# Patient Record
Sex: Female | Born: 1945 | Race: Black or African American | Hispanic: No | Smoking: Never smoker
Health system: Southern US, Community
[De-identification: ages and names within clinical notes are randomized; demographics above are authoritative.]

## PROBLEM LIST (undated history)

## (undated) DIAGNOSIS — E7800 Pure hypercholesterolemia, unspecified: Secondary | ICD-10-CM

## (undated) DIAGNOSIS — D649 Anemia, unspecified: Secondary | ICD-10-CM

## (undated) DIAGNOSIS — I10 Essential (primary) hypertension: Secondary | ICD-10-CM

## (undated) DIAGNOSIS — R42 Dizziness and giddiness: Secondary | ICD-10-CM

## (undated) HISTORY — PX: REPLACEMENT TOTAL KNEE: SUR1224

## (undated) HISTORY — DX: Essential (primary) hypertension: I10

## (undated) HISTORY — PX: BREAST BIOPSY: SHX20

## (undated) HISTORY — PX: ABDOMINAL HYSTERECTOMY: SHX81

## (undated) HISTORY — DX: Dizziness and giddiness: R42

## (undated) HISTORY — DX: Anemia, unspecified: D64.9

---

## 2002-03-02 ENCOUNTER — Emergency Department (HOSPITAL_COMMUNITY): Admission: EM | Admit: 2002-03-02 | Discharge: 2002-03-02 | Payer: Self-pay | Admitting: Internal Medicine

## 2002-03-02 ENCOUNTER — Encounter: Payer: Self-pay | Admitting: Internal Medicine

## 2004-02-24 ENCOUNTER — Emergency Department (HOSPITAL_COMMUNITY): Admission: EM | Admit: 2004-02-24 | Discharge: 2004-02-24 | Payer: Self-pay | Admitting: Emergency Medicine

## 2005-08-16 ENCOUNTER — Ambulatory Visit: Payer: Self-pay

## 2006-06-27 ENCOUNTER — Emergency Department (HOSPITAL_COMMUNITY): Admission: EM | Admit: 2006-06-27 | Discharge: 2006-06-27 | Payer: Self-pay | Admitting: Emergency Medicine

## 2006-09-16 ENCOUNTER — Emergency Department (HOSPITAL_COMMUNITY): Admission: EM | Admit: 2006-09-16 | Discharge: 2006-09-16 | Payer: Self-pay | Admitting: Emergency Medicine

## 2007-01-03 ENCOUNTER — Ambulatory Visit: Payer: Self-pay | Admitting: Orthopedic Surgery

## 2007-07-22 ENCOUNTER — Emergency Department (HOSPITAL_COMMUNITY): Admission: EM | Admit: 2007-07-22 | Discharge: 2007-07-23 | Payer: Self-pay | Admitting: Emergency Medicine

## 2007-08-17 ENCOUNTER — Emergency Department (HOSPITAL_COMMUNITY): Admission: EM | Admit: 2007-08-17 | Discharge: 2007-08-17 | Payer: Self-pay | Admitting: Emergency Medicine

## 2007-08-23 DIAGNOSIS — E119 Type 2 diabetes mellitus without complications: Secondary | ICD-10-CM

## 2007-08-26 ENCOUNTER — Ambulatory Visit: Payer: Self-pay | Admitting: Orthopedic Surgery

## 2007-08-26 DIAGNOSIS — M1710 Unilateral primary osteoarthritis, unspecified knee: Secondary | ICD-10-CM

## 2007-08-26 DIAGNOSIS — IMO0002 Reserved for concepts with insufficient information to code with codable children: Secondary | ICD-10-CM | POA: Insufficient documentation

## 2007-10-14 ENCOUNTER — Ambulatory Visit: Payer: Self-pay | Admitting: Orthopedic Surgery

## 2007-10-14 ENCOUNTER — Inpatient Hospital Stay (HOSPITAL_COMMUNITY): Admission: EM | Admit: 2007-10-14 | Discharge: 2007-10-17 | Payer: Self-pay | Admitting: Emergency Medicine

## 2007-10-16 ENCOUNTER — Encounter: Payer: Self-pay | Admitting: Orthopedic Surgery

## 2007-10-17 ENCOUNTER — Encounter: Payer: Self-pay | Admitting: Orthopedic Surgery

## 2007-11-27 ENCOUNTER — Ambulatory Visit: Payer: Self-pay | Admitting: Orthopedic Surgery

## 2007-12-02 ENCOUNTER — Emergency Department (HOSPITAL_COMMUNITY): Admission: EM | Admit: 2007-12-02 | Discharge: 2007-12-02 | Payer: Self-pay | Admitting: Emergency Medicine

## 2007-12-11 ENCOUNTER — Ambulatory Visit: Payer: Self-pay | Admitting: Gastroenterology

## 2007-12-13 ENCOUNTER — Ambulatory Visit (HOSPITAL_COMMUNITY): Admission: RE | Admit: 2007-12-13 | Discharge: 2007-12-13 | Payer: Self-pay | Admitting: Gastroenterology

## 2007-12-13 ENCOUNTER — Ambulatory Visit: Payer: Self-pay | Admitting: Gastroenterology

## 2007-12-15 ENCOUNTER — Emergency Department (HOSPITAL_COMMUNITY): Admission: EM | Admit: 2007-12-15 | Discharge: 2007-12-15 | Payer: Self-pay | Admitting: Emergency Medicine

## 2007-12-26 ENCOUNTER — Ambulatory Visit (HOSPITAL_COMMUNITY): Admission: RE | Admit: 2007-12-26 | Discharge: 2007-12-26 | Payer: Self-pay | Admitting: Gastroenterology

## 2007-12-26 ENCOUNTER — Encounter: Payer: Self-pay | Admitting: Gastroenterology

## 2007-12-26 ENCOUNTER — Ambulatory Visit: Payer: Self-pay | Admitting: Gastroenterology

## 2007-12-30 ENCOUNTER — Ambulatory Visit: Payer: Self-pay | Admitting: Orthopedic Surgery

## 2007-12-31 ENCOUNTER — Encounter: Payer: Self-pay | Admitting: Orthopedic Surgery

## 2008-01-02 ENCOUNTER — Encounter: Payer: Self-pay | Admitting: Orthopedic Surgery

## 2008-01-03 ENCOUNTER — Telehealth: Payer: Self-pay | Admitting: Orthopedic Surgery

## 2008-02-06 ENCOUNTER — Ambulatory Visit: Payer: Self-pay | Admitting: Orthopedic Surgery

## 2008-03-19 ENCOUNTER — Ambulatory Visit: Payer: Self-pay | Admitting: Gastroenterology

## 2008-04-27 LAB — CONVERTED CEMR LAB: Pap Smear: NORMAL

## 2008-07-15 ENCOUNTER — Ambulatory Visit: Payer: Self-pay | Admitting: Gastroenterology

## 2008-08-16 ENCOUNTER — Emergency Department (HOSPITAL_COMMUNITY): Admission: EM | Admit: 2008-08-16 | Discharge: 2008-08-17 | Payer: Self-pay | Admitting: Emergency Medicine

## 2008-08-27 ENCOUNTER — Ambulatory Visit: Payer: Self-pay | Admitting: Orthopedic Surgery

## 2008-09-08 ENCOUNTER — Telehealth: Payer: Self-pay | Admitting: Orthopedic Surgery

## 2008-09-14 ENCOUNTER — Telehealth: Payer: Self-pay | Admitting: Orthopedic Surgery

## 2008-09-14 ENCOUNTER — Encounter: Payer: Self-pay | Admitting: Orthopedic Surgery

## 2008-09-15 ENCOUNTER — Ambulatory Visit: Payer: Self-pay | Admitting: Orthopedic Surgery

## 2008-09-15 ENCOUNTER — Inpatient Hospital Stay (HOSPITAL_COMMUNITY): Admission: RE | Admit: 2008-09-15 | Discharge: 2008-09-18 | Payer: Self-pay | Admitting: Orthopedic Surgery

## 2008-09-18 ENCOUNTER — Encounter: Payer: Self-pay | Admitting: Orthopedic Surgery

## 2008-09-19 ENCOUNTER — Encounter: Payer: Self-pay | Admitting: Orthopedic Surgery

## 2008-09-21 ENCOUNTER — Telehealth: Payer: Self-pay | Admitting: Orthopedic Surgery

## 2008-09-25 ENCOUNTER — Encounter: Payer: Self-pay | Admitting: Orthopedic Surgery

## 2008-09-28 ENCOUNTER — Encounter: Payer: Self-pay | Admitting: Orthopedic Surgery

## 2008-09-29 ENCOUNTER — Ambulatory Visit: Payer: Self-pay | Admitting: Orthopedic Surgery

## 2008-09-29 DIAGNOSIS — Z96659 Presence of unspecified artificial knee joint: Secondary | ICD-10-CM | POA: Insufficient documentation

## 2008-10-02 ENCOUNTER — Encounter: Payer: Self-pay | Admitting: Orthopedic Surgery

## 2008-10-16 ENCOUNTER — Encounter: Payer: Self-pay | Admitting: Orthopedic Surgery

## 2008-10-19 ENCOUNTER — Telehealth: Payer: Self-pay | Admitting: Orthopedic Surgery

## 2008-10-27 ENCOUNTER — Encounter: Payer: Self-pay | Admitting: Orthopedic Surgery

## 2008-10-27 ENCOUNTER — Ambulatory Visit: Payer: Self-pay | Admitting: Gastroenterology

## 2008-10-27 ENCOUNTER — Encounter (HOSPITAL_COMMUNITY): Admission: RE | Admit: 2008-10-27 | Discharge: 2008-11-12 | Payer: Self-pay | Admitting: Orthopedic Surgery

## 2008-10-28 ENCOUNTER — Ambulatory Visit: Payer: Self-pay | Admitting: Orthopedic Surgery

## 2008-11-16 ENCOUNTER — Encounter (HOSPITAL_COMMUNITY): Admission: RE | Admit: 2008-11-16 | Discharge: 2008-11-20 | Payer: Self-pay | Admitting: Orthopedic Surgery

## 2008-11-20 ENCOUNTER — Encounter: Payer: Self-pay | Admitting: Orthopedic Surgery

## 2008-11-20 ENCOUNTER — Ambulatory Visit: Payer: Self-pay | Admitting: Internal Medicine

## 2008-11-20 DIAGNOSIS — E785 Hyperlipidemia, unspecified: Secondary | ICD-10-CM | POA: Insufficient documentation

## 2008-11-20 DIAGNOSIS — H269 Unspecified cataract: Secondary | ICD-10-CM

## 2008-11-20 DIAGNOSIS — I10 Essential (primary) hypertension: Secondary | ICD-10-CM | POA: Insufficient documentation

## 2008-11-20 DIAGNOSIS — K219 Gastro-esophageal reflux disease without esophagitis: Secondary | ICD-10-CM

## 2008-11-20 DIAGNOSIS — D509 Iron deficiency anemia, unspecified: Secondary | ICD-10-CM | POA: Insufficient documentation

## 2008-11-20 DIAGNOSIS — J309 Allergic rhinitis, unspecified: Secondary | ICD-10-CM | POA: Insufficient documentation

## 2008-11-20 LAB — CONVERTED CEMR LAB
Blood Glucose, Fingerstick: 130
Hgb A1c MFr Bld: 6 %

## 2008-11-23 LAB — CONVERTED CEMR LAB
ALT: <8 units/L (ref 0–35)
AST: 7 units/L (ref 0–37)
Albumin: 4.5 g/dL (ref 3.5–5.2)
Alkaline Phosphatase: 70 units/L (ref 39–117)
BUN: 15 mg/dL (ref 6–23)
Basophils Absolute: 0 10*3/uL (ref 0.0–0.1)
Basophils Relative: 1 % (ref 0–1)
CO2: 21 meq/L (ref 19–32)
Calcium: 9.5 mg/dL (ref 8.4–10.5)
Chloride: 105 meq/L (ref 96–112)
Cholesterol: 204 mg/dL — ABNORMAL HIGH (ref 0–200)
Creatinine, Ser: 1.24 mg/dL — ABNORMAL HIGH (ref 0.40–1.20)
Creatinine, Urine: 244.5 mg/dL
Eosinophils Absolute: 0.1 10*3/uL (ref 0.0–0.7)
Eosinophils Relative: 3 % (ref 0–5)
Ferritin: 135 ng/mL (ref 10–291)
Glucose, Bld: 116 mg/dL — ABNORMAL HIGH (ref 70–99)
HCT: 33.3 % — ABNORMAL LOW (ref 36.0–46.0)
HDL: 38 mg/dL — ABNORMAL LOW (ref >39)
Hemoglobin: 10.9 g/dL — ABNORMAL LOW (ref 12.0–15.0)
Iron: 58 ug/dL (ref 42–145)
LDL Cholesterol: 148 mg/dL — ABNORMAL HIGH (ref 0–99)
Lymphocytes Relative: 47 % — ABNORMAL HIGH (ref 12–46)
Lymphs Abs: 1.7 10*3/uL (ref 0.7–4.0)
MCHC: 32.7 g/dL (ref 30.0–36.0)
MCV: 87.9 fL (ref 78.0–100.0)
Microalb Creat Ratio: 4.9 mg/g (ref 0.0–30.0)
Microalb, Ur: 1.19 mg/dL (ref 0.00–1.89)
Monocytes Absolute: 0.3 10*3/uL (ref 0.1–1.0)
Monocytes Relative: 7 % (ref 3–12)
Neutro Abs: 1.5 10*3/uL — ABNORMAL LOW (ref 1.7–7.7)
Neutrophils Relative %: 42 % — ABNORMAL LOW (ref 43–77)
Platelets: 254 10*3/uL (ref 150–400)
Potassium: 3.8 meq/L (ref 3.5–5.3)
RBC: 3.79 M/uL — ABNORMAL LOW (ref 3.87–5.11)
RDW: 13.5 % (ref 11.5–15.5)
Saturation Ratios: 18 % — ABNORMAL LOW (ref 20–55)
Sodium: 140 meq/L (ref 135–145)
TIBC: 322 ug/dL (ref 250–470)
Total Bilirubin: 0.3 mg/dL (ref 0.3–1.2)
Total CHOL/HDL Ratio: 5.4
Total Protein: 7.4 g/dL (ref 6.0–8.3)
Triglycerides: 92 mg/dL (ref <150)
UIBC: 264 ug/dL
VLDL: 18 mg/dL (ref 0–40)
WBC: 3.6 10*3/uL — ABNORMAL LOW (ref 4.0–10.5)

## 2008-11-26 ENCOUNTER — Encounter (INDEPENDENT_AMBULATORY_CARE_PROVIDER_SITE_OTHER): Payer: Self-pay | Admitting: Internal Medicine

## 2008-11-30 LAB — CONVERTED CEMR LAB
Basophils Absolute: 0 10*3/uL (ref 0.0–0.1)
Basophils Relative: 1 % (ref 0–1)
Eosinophils Absolute: 0.1 10*3/uL (ref 0.0–0.7)
Eosinophils Relative: 3 % (ref 0–5)
HCT: 35.2 % — ABNORMAL LOW (ref 36.0–46.0)
Hemoglobin: 11.3 g/dL — ABNORMAL LOW (ref 12.0–15.0)
Lymphocytes Relative: 47 % — ABNORMAL HIGH (ref 12–46)
Lymphs Abs: 1.7 10*3/uL (ref 0.7–4.0)
MCHC: 32.1 g/dL (ref 30.0–36.0)
MCV: 89.6 fL (ref 78.0–100.0)
Monocytes Absolute: 0.2 10*3/uL (ref 0.1–1.0)
Monocytes Relative: 7 % (ref 3–12)
Neutro Abs: 1.5 10*3/uL — ABNORMAL LOW (ref 1.7–7.7)
Neutrophils Relative %: 43 % (ref 43–77)
Platelets: 249 10*3/uL (ref 150–400)
RBC: 3.93 M/uL (ref 3.87–5.11)
RDW: 13.5 % (ref 11.5–15.5)
Vitamin B-12: 297 pg/mL (ref 211–911)
WBC: 3.5 10*3/uL — ABNORMAL LOW (ref 4.0–10.5)

## 2008-12-01 ENCOUNTER — Encounter (INDEPENDENT_AMBULATORY_CARE_PROVIDER_SITE_OTHER): Payer: Self-pay | Admitting: Internal Medicine

## 2008-12-01 ENCOUNTER — Ambulatory Visit (HOSPITAL_COMMUNITY): Admission: RE | Admit: 2008-12-01 | Discharge: 2008-12-01 | Payer: Self-pay | Admitting: Internal Medicine

## 2008-12-09 ENCOUNTER — Ambulatory Visit: Payer: Self-pay | Admitting: Orthopedic Surgery

## 2008-12-10 ENCOUNTER — Emergency Department (HOSPITAL_COMMUNITY): Admission: EM | Admit: 2008-12-10 | Discharge: 2008-12-10 | Payer: Self-pay | Admitting: Emergency Medicine

## 2008-12-15 ENCOUNTER — Telehealth (INDEPENDENT_AMBULATORY_CARE_PROVIDER_SITE_OTHER): Payer: Self-pay | Admitting: *Deleted

## 2008-12-21 ENCOUNTER — Encounter: Payer: Self-pay | Admitting: Orthopedic Surgery

## 2009-01-13 ENCOUNTER — Ambulatory Visit: Payer: Self-pay | Admitting: Internal Medicine

## 2009-01-13 DIAGNOSIS — K5289 Other specified noninfective gastroenteritis and colitis: Secondary | ICD-10-CM

## 2009-02-05 ENCOUNTER — Telehealth (INDEPENDENT_AMBULATORY_CARE_PROVIDER_SITE_OTHER): Payer: Self-pay | Admitting: Internal Medicine

## 2009-02-19 ENCOUNTER — Ambulatory Visit: Payer: Self-pay | Admitting: Internal Medicine

## 2009-02-19 DIAGNOSIS — D72819 Decreased white blood cell count, unspecified: Secondary | ICD-10-CM | POA: Insufficient documentation

## 2009-02-19 LAB — CONVERTED CEMR LAB
Blood Glucose, Fingerstick: 108
Hgb A1c MFr Bld: 6.9 %

## 2009-03-16 ENCOUNTER — Encounter (INDEPENDENT_AMBULATORY_CARE_PROVIDER_SITE_OTHER): Payer: Self-pay | Admitting: Internal Medicine

## 2009-03-17 LAB — CONVERTED CEMR LAB
ALT: <8 units/L (ref 0–35)
AST: 8 units/L (ref 0–37)
Albumin: 4.4 g/dL (ref 3.5–5.2)
Alkaline Phosphatase: 63 units/L (ref 39–117)
Basophils Absolute: 0 10*3/uL (ref 0.0–0.1)
Basophils Relative: 1 % (ref 0–1)
Bilirubin, Direct: <0.1 mg/dL (ref 0.0–0.3)
Eosinophils Absolute: 0.1 10*3/uL (ref 0.0–0.7)
Eosinophils Relative: 3 % (ref 0–5)
Ferritin: 100 ng/mL (ref 10–291)
HCT: 30.2 % — ABNORMAL LOW (ref 36.0–46.0)
Hemoglobin: 9.8 g/dL — ABNORMAL LOW (ref 12.0–15.0)
Iron: 66 ug/dL (ref 42–145)
Lymphocytes Relative: 43 % (ref 12–46)
Lymphs Abs: 1.8 10*3/uL (ref 0.7–4.0)
MCHC: 32.5 g/dL (ref 30.0–36.0)
MCV: 88.8 fL (ref 78.0–100.0)
Monocytes Absolute: 0.3 10*3/uL (ref 0.1–1.0)
Monocytes Relative: 7 % (ref 3–12)
Neutro Abs: 1.9 10*3/uL (ref 1.7–7.7)
Neutrophils Relative %: 46 % (ref 43–77)
Platelets: 217 10*3/uL (ref 150–400)
RBC: 3.4 M/uL — ABNORMAL LOW (ref 3.87–5.11)
RDW: 15.1 % (ref 11.5–15.5)
Saturation Ratios: 22 % (ref 20–55)
TIBC: 297 ug/dL (ref 250–470)
Total Bilirubin: 0.3 mg/dL (ref 0.3–1.2)
Total Protein: 7.3 g/dL (ref 6.0–8.3)
UIBC: 231 ug/dL
WBC: 4.1 10*3/uL (ref 4.0–10.5)

## 2009-03-18 ENCOUNTER — Ambulatory Visit: Payer: Self-pay | Admitting: Orthopedic Surgery

## 2009-04-04 ENCOUNTER — Emergency Department (HOSPITAL_COMMUNITY): Admission: EM | Admit: 2009-04-04 | Discharge: 2009-04-04 | Payer: Self-pay | Admitting: Emergency Medicine

## 2009-04-06 ENCOUNTER — Ambulatory Visit: Payer: Self-pay | Admitting: Internal Medicine

## 2009-04-06 DIAGNOSIS — R197 Diarrhea, unspecified: Secondary | ICD-10-CM

## 2009-04-06 DIAGNOSIS — D485 Neoplasm of uncertain behavior of skin: Secondary | ICD-10-CM

## 2009-04-06 DIAGNOSIS — D649 Anemia, unspecified: Secondary | ICD-10-CM

## 2009-04-07 ENCOUNTER — Encounter (INDEPENDENT_AMBULATORY_CARE_PROVIDER_SITE_OTHER): Payer: Self-pay | Admitting: Internal Medicine

## 2009-04-29 ENCOUNTER — Emergency Department (HOSPITAL_COMMUNITY): Admission: EM | Admit: 2009-04-29 | Discharge: 2009-04-29 | Payer: Self-pay | Admitting: Emergency Medicine

## 2009-04-29 LAB — CONVERTED CEMR LAB
Basophils Absolute: 0 10*3/uL (ref 0.0–0.1)
Basophils Relative: 1 % (ref 0–1)
Eosinophils Absolute: 0.1 10*3/uL (ref 0.0–0.7)
Eosinophils Relative: 2 % (ref 0–5)
HCT: 33.2 % — ABNORMAL LOW (ref 36.0–46.0)
Hemoglobin: 10.7 g/dL — ABNORMAL LOW (ref 12.0–15.0)
Lymphocytes Relative: 55 % — ABNORMAL HIGH (ref 12–46)
Lymphs Abs: 1.8 10*3/uL (ref 0.7–4.0)
MCHC: 32.2 g/dL (ref 30.0–36.0)
MCV: 88.3 fL (ref 78.0–100.0)
Monocytes Absolute: 0.2 10*3/uL (ref 0.1–1.0)
Monocytes Relative: 5 % (ref 3–12)
Neutro Abs: 1.2 10*3/uL — ABNORMAL LOW (ref 1.7–7.7)
Neutrophils Relative %: 37 % — ABNORMAL LOW (ref 43–77)
Platelets: 231 10*3/uL (ref 150–400)
RBC: 3.76 M/uL — ABNORMAL LOW (ref 3.87–5.11)
RDW: 14.8 % (ref 11.5–15.5)
TSH: 2.218 microintl units/mL (ref 0.350–4.500)
WBC: 3.2 10*3/uL — ABNORMAL LOW (ref 4.0–10.5)

## 2009-05-07 ENCOUNTER — Encounter (HOSPITAL_COMMUNITY): Admission: RE | Admit: 2009-05-07 | Discharge: 2009-06-06 | Payer: Self-pay | Admitting: Oncology

## 2009-05-07 ENCOUNTER — Ambulatory Visit (HOSPITAL_COMMUNITY): Payer: Self-pay | Admitting: Oncology

## 2009-05-07 ENCOUNTER — Encounter (INDEPENDENT_AMBULATORY_CARE_PROVIDER_SITE_OTHER): Payer: Self-pay | Admitting: Internal Medicine

## 2009-06-03 ENCOUNTER — Ambulatory Visit: Payer: Self-pay | Admitting: Internal Medicine

## 2009-06-03 LAB — CONVERTED CEMR LAB: Hgb A1c MFr Bld: 5.9 %

## 2009-06-06 ENCOUNTER — Emergency Department (HOSPITAL_COMMUNITY): Admission: EM | Admit: 2009-06-06 | Discharge: 2009-06-06 | Payer: Self-pay | Admitting: Emergency Medicine

## 2009-06-07 ENCOUNTER — Encounter (INDEPENDENT_AMBULATORY_CARE_PROVIDER_SITE_OTHER): Payer: Self-pay | Admitting: Internal Medicine

## 2009-06-08 ENCOUNTER — Encounter (INDEPENDENT_AMBULATORY_CARE_PROVIDER_SITE_OTHER): Payer: Self-pay | Admitting: *Deleted

## 2009-06-08 LAB — CONVERTED CEMR LAB
Cholesterol: 156 mg/dL (ref 0–200)
HDL: 42 mg/dL (ref >39)
LDL Cholesterol: 100 mg/dL — ABNORMAL HIGH (ref 0–99)
Total CHOL/HDL Ratio: 3.7
Triglycerides: 68 mg/dL (ref <150)
VLDL: 14 mg/dL (ref 0–40)

## 2009-08-29 ENCOUNTER — Emergency Department (HOSPITAL_COMMUNITY): Admission: EM | Admit: 2009-08-29 | Discharge: 2009-08-29 | Payer: Self-pay | Admitting: Emergency Medicine

## 2009-10-05 ENCOUNTER — Ambulatory Visit: Payer: Self-pay | Admitting: Orthopedic Surgery

## 2009-12-03 ENCOUNTER — Ambulatory Visit (HOSPITAL_COMMUNITY): Admission: RE | Admit: 2009-12-03 | Discharge: 2009-12-03 | Payer: Self-pay | Admitting: Family Medicine

## 2009-12-08 ENCOUNTER — Ambulatory Visit (HOSPITAL_COMMUNITY): Admission: RE | Admit: 2009-12-08 | Discharge: 2009-12-08 | Payer: Self-pay | Admitting: Family Medicine

## 2010-02-03 ENCOUNTER — Ambulatory Visit (HOSPITAL_COMMUNITY): Payer: Self-pay | Admitting: Oncology

## 2010-02-03 ENCOUNTER — Encounter (HOSPITAL_COMMUNITY): Admission: RE | Admit: 2010-02-03 | Discharge: 2010-03-05 | Payer: Self-pay | Admitting: Oncology

## 2010-04-11 ENCOUNTER — Ambulatory Visit (HOSPITAL_COMMUNITY)
Admission: RE | Admit: 2010-04-11 | Discharge: 2010-04-11 | Payer: Self-pay | Source: Home / Self Care | Admitting: Family Medicine

## 2010-10-18 ENCOUNTER — Ambulatory Visit: Payer: Self-pay | Admitting: Orthopedic Surgery

## 2010-11-17 ENCOUNTER — Emergency Department (HOSPITAL_COMMUNITY)
Admission: EM | Admit: 2010-11-17 | Discharge: 2010-11-17 | Payer: Self-pay | Source: Home / Self Care | Admitting: Emergency Medicine

## 2010-11-17 LAB — CBC
HCT: 31.7 % — ABNORMAL LOW (ref 36.0–46.0)
Hemoglobin: 10.8 g/dL — ABNORMAL LOW (ref 12.0–15.0)
MCH: 31 pg (ref 26.0–34.0)
MCHC: 34.1 g/dL (ref 30.0–36.0)
MCV: 91.1 fL (ref 78.0–100.0)
Platelets: 202 10*3/uL (ref 150–400)
RBC: 3.48 MIL/uL — ABNORMAL LOW (ref 3.87–5.11)
RDW: 13.5 % (ref 11.5–15.5)
WBC: 5 10*3/uL (ref 4.0–10.5)

## 2010-11-17 LAB — DIFFERENTIAL
Basophils Absolute: 0 10*3/uL (ref 0.0–0.1)
Basophils Relative: 1 % (ref 0–1)
Eosinophils Absolute: 0.1 10*3/uL (ref 0.0–0.7)
Eosinophils Relative: 2 % (ref 0–5)
Lymphocytes Relative: 33 % (ref 12–46)
Lymphs Abs: 1.6 10*3/uL (ref 0.7–4.0)
Monocytes Absolute: 0.3 10*3/uL (ref 0.1–1.0)
Monocytes Relative: 7 % (ref 3–12)
Neutro Abs: 2.9 10*3/uL (ref 1.7–7.7)
Neutrophils Relative %: 58 % (ref 43–77)

## 2010-11-17 LAB — BASIC METABOLIC PANEL
BUN: 15 mg/dL (ref 6–23)
CO2: 22 mEq/L (ref 19–32)
Calcium: 9.6 mg/dL (ref 8.4–10.5)
Chloride: 109 mEq/L (ref 96–112)
Creatinine, Ser: 1.42 mg/dL — ABNORMAL HIGH (ref 0.4–1.2)
GFR calc Af Amer: 45 mL/min — ABNORMAL LOW (ref >60)
GFR calc non Af Amer: 37 mL/min — ABNORMAL LOW (ref >60)
Glucose, Bld: 84 mg/dL (ref 70–99)
Potassium: 3.9 mEq/L (ref 3.5–5.1)
Sodium: 141 mEq/L (ref 135–145)

## 2010-11-17 LAB — URINALYSIS, ROUTINE W REFLEX MICROSCOPIC
Bilirubin Urine: NEGATIVE
Hemoglobin, Urine: NEGATIVE
Ketones, ur: NEGATIVE mg/dL
Nitrite: NEGATIVE
Protein, ur: NEGATIVE mg/dL
Specific Gravity, Urine: >1.03 — ABNORMAL HIGH (ref 1.005–1.030)
Urine Glucose, Fasting: NEGATIVE mg/dL
Urobilinogen, UA: 0.2 mg/dL (ref 0.0–1.0)
pH: 5.5 (ref 5.0–8.0)

## 2010-12-02 ENCOUNTER — Encounter (HOSPITAL_COMMUNITY)
Admission: RE | Admit: 2010-12-02 | Discharge: 2010-12-13 | Payer: Self-pay | Source: Home / Self Care | Attending: Oncology | Admitting: Oncology

## 2010-12-05 ENCOUNTER — Ambulatory Visit (HOSPITAL_COMMUNITY): Admit: 2010-12-05 | Discharge: 2010-12-13 | Payer: Self-pay | Attending: Oncology | Admitting: Oncology

## 2010-12-12 ENCOUNTER — Ambulatory Visit (HOSPITAL_COMMUNITY)
Admission: RE | Admit: 2010-12-12 | Discharge: 2010-12-12 | Payer: Self-pay | Source: Home / Self Care | Attending: Family Medicine | Admitting: Family Medicine

## 2010-12-13 NOTE — Assessment & Plan Note (Signed)
Summary: 1 YR XR TKA/MCR/BSF   Visit Type:  Follow-up  CC:  recheck TKA right.  History of Present Illness: I saw [name] [name] in the office today for a 1 YEAR followup visit.  She is a 65 years old woman with the complaint of:  RIGHT TKA.  DOS 09-15-08. Right TKA.  DX:  OA of right knee.  Treatment:  Surgery.  Complaints:  No problems.  Today, scheduled for:  recheck with xray.  Doing well.  Meds: Simvastatin, Lopid 600mg  two times a day, Metformin, Carvedilol, Allopurinol, Naproxen 500mg , Lisinopril/HCTZ, Glipizide er.  ROS NORMAL MSK   Allergies: 1)  ! Penicillin 2)  ! Penicillin   Knee Exam  General:    Well-developed, well-nourished, normal body habitus; no deformities, normal grooming.  Gait:    Normal heel-toe gait pattern bilaterally.    Inspection:     No deformity, ecchymosis or swelling.   Palpation:    Non-tender to palpation over medial joint line, lateral joint line, parapatellar, condylar, patellar tendon, or Pes bursa.   Motor:    Motor strength 5/5 bilaterally for quadriceps, hamstrings, ankle dorsiflexion, and ankle plantar flexion.    Knee Exam:    Right:    Stability:  stable    Inspection/Palpation:  120 ROM   Impression & Recommendations:  Problem # 1:  KNEE, ARTHRITIS, DEGEN./OSTEO (ZOX-096.04) Assessment Comment Only  3 views right knee The  alignment was normal, PTF reduced without tilt or subluxation, no evidence of loosening.  IMPRESSION: normal appearance of the implant   Her updated medication list for this problem includes:    Adult Aspirin Ec Low Strength 81 Mg Tbec (Aspirin)    Acetaminophen Extra Strength 500 Mg Tabs (Acetaminophen) .Marland Kitchen... As needed    Naproxen 500 Mg Tabs (Naproxen) .Marland Kitchen... 1 twice daily  Orders: Est. Patient Level III (54098) Knee x-ray,  3 views (11914)  Patient Instructions: 1)  Please schedule a follow-up appointment in 1 year. 2)  xrays TKA right leg    Orders Added: 1)  Est. Patient Level  III [78295] 2)  Knee x-ray,  3 views [73562]

## 2011-01-23 IMAGING — CR DG SHOULDER 2+V*R*
3 series · 3 of 3 positions shown · non-contrast
Comparison: None

CLINICAL DATA: Shoulder pain for 5 days.

RIGHT SHOULDER - 2+ VIEW

[view not recorded (1 of 3)]
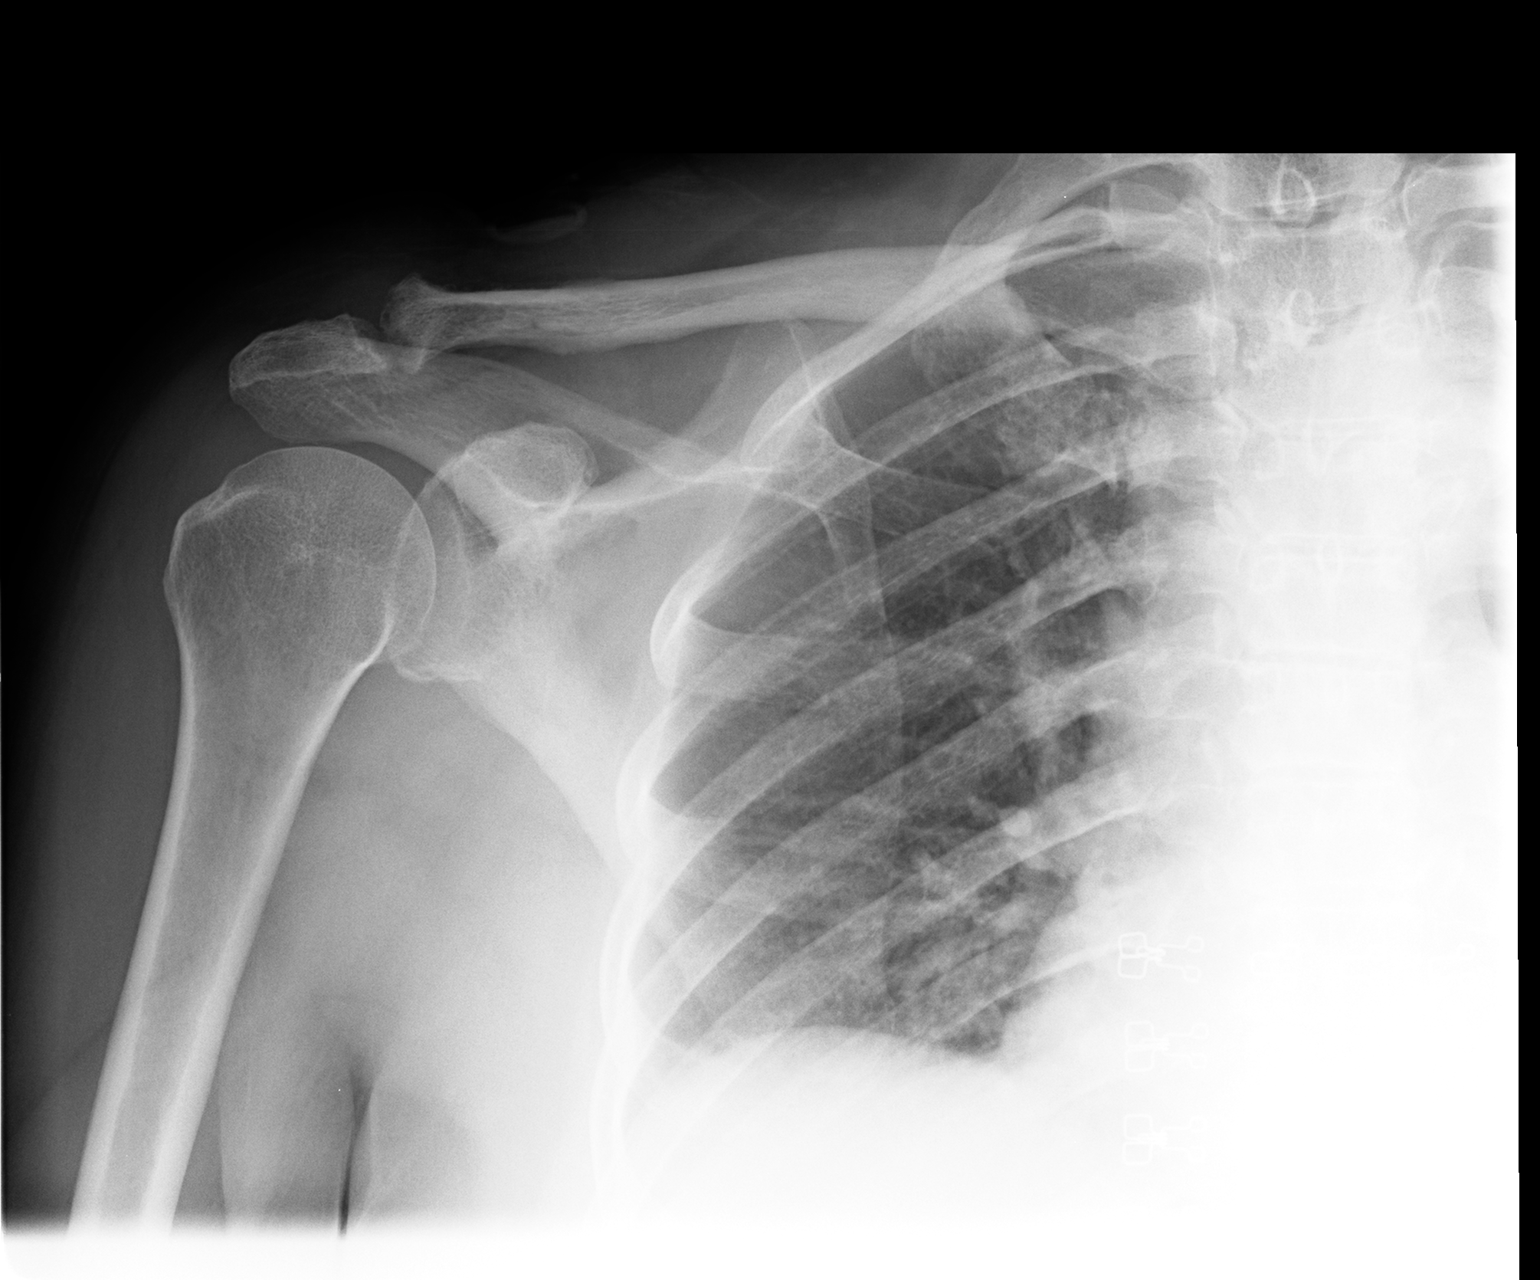

[view not recorded (2 of 3)]
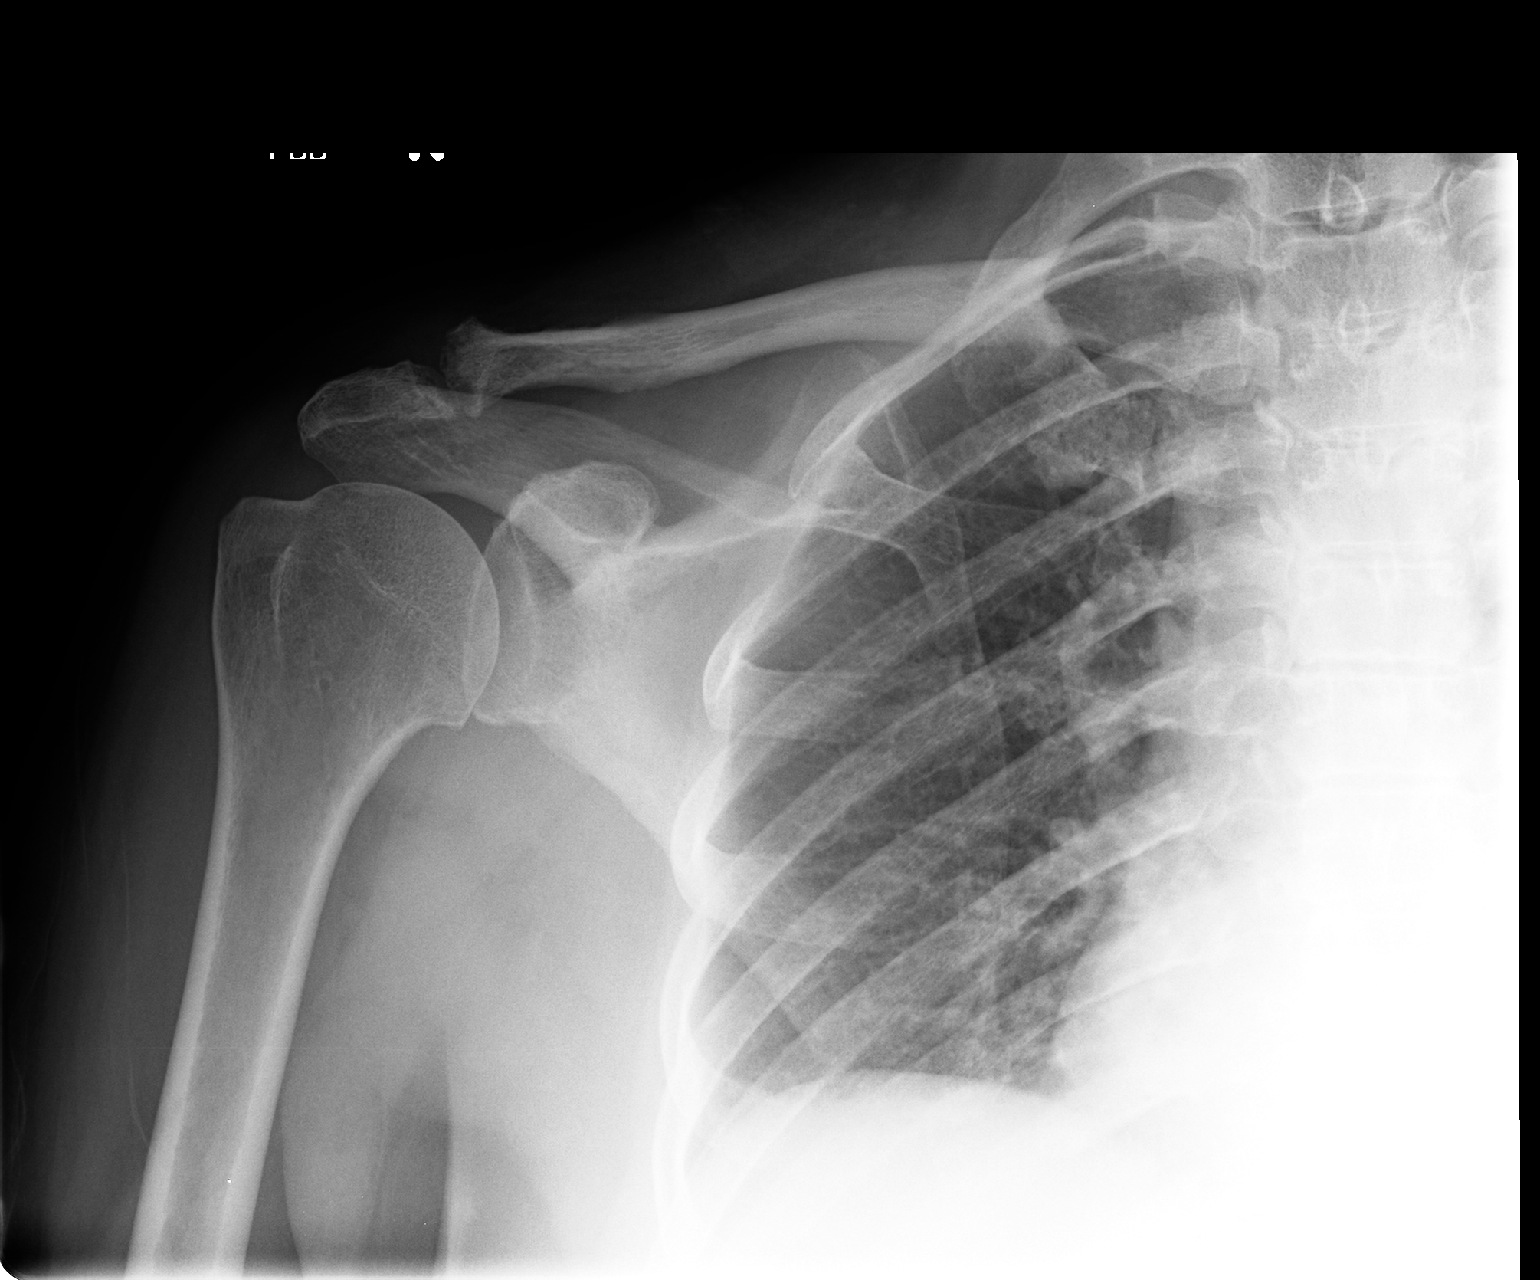

[view not recorded (3 of 3)]
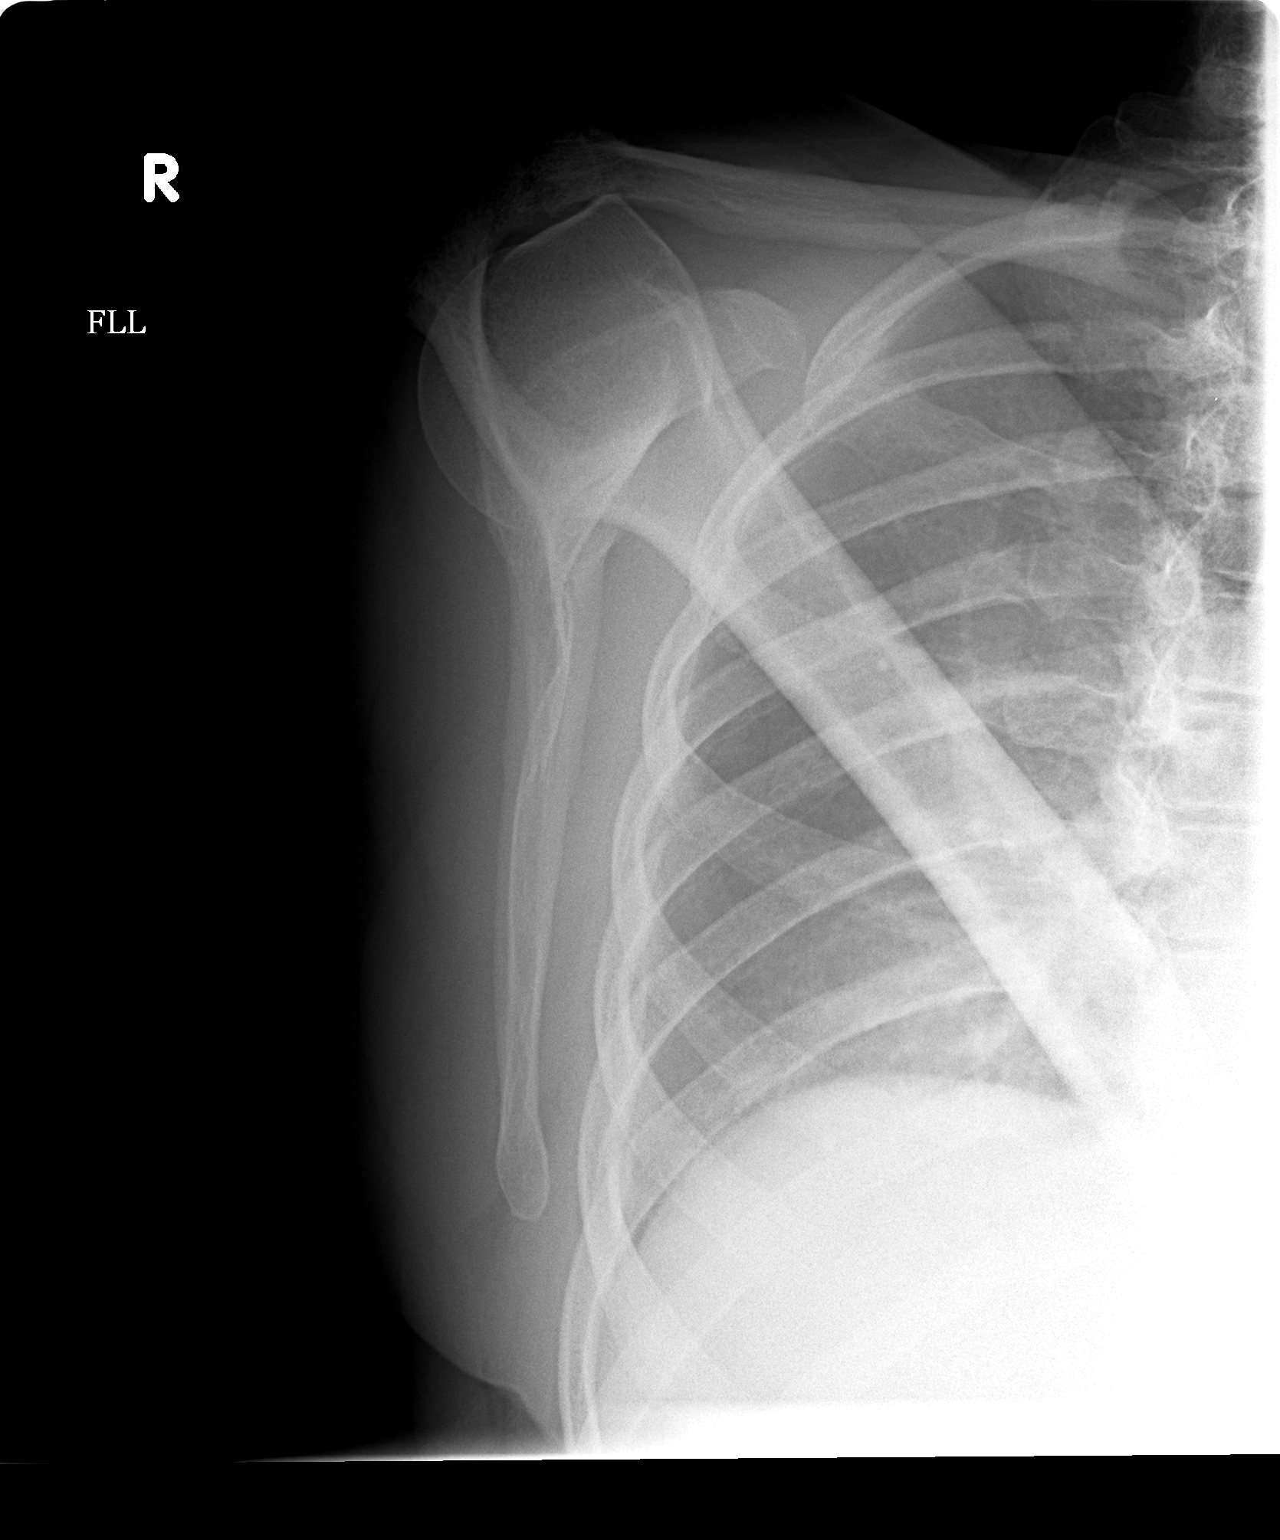

[3 of 3 positions shown; findings below may reference images not displayed]

FINDINGS: Negative for fracture or dislocation.  There is
degenerative change and spurring in the acromioclavicular joint.
The acromion is horizontal in  orientation.
IMPRESSION: No acute abnormality.

## 2011-02-05 LAB — CBC
HCT: 30 % — ABNORMAL LOW (ref 36.0–46.0)
Platelets: 191 10*3/uL (ref 150–400)
RBC: 3.4 MIL/uL — ABNORMAL LOW (ref 3.87–5.11)
WBC: 4.5 10*3/uL (ref 4.0–10.5)

## 2011-02-05 LAB — DIFFERENTIAL
Eosinophils Relative: 3 % (ref 0–5)
Lymphocytes Relative: 37 % (ref 12–46)
Lymphs Abs: 1.7 10*3/uL (ref 0.7–4.0)
Monocytes Relative: 6 % (ref 3–12)
Neutrophils Relative %: 54 % (ref 43–77)

## 2011-02-19 LAB — GLUCOSE, CAPILLARY: Glucose-Capillary: 73 mg/dL (ref 70–99)

## 2011-02-20 LAB — COMPREHENSIVE METABOLIC PANEL
ALT: <8 U/L (ref 0–35)
AST: 15 U/L (ref 0–37)
Albumin: 3.9 g/dL (ref 3.5–5.2)
Alkaline Phosphatase: 56 U/L (ref 39–117)
Potassium: 3.8 mEq/L (ref 3.5–5.1)
Sodium: 140 mEq/L (ref 135–145)
Total Protein: 7.1 g/dL (ref 6.0–8.3)

## 2011-02-20 LAB — RETICULOCYTES
RBC.: 3.25 MIL/uL — ABNORMAL LOW (ref 3.87–5.11)
Retic Count, Absolute: 52 10*3/uL (ref 19.0–186.0)
Retic Ct Pct: 1.6 % (ref 0.4–3.1)

## 2011-02-20 LAB — VITAMIN B12: Vitamin B-12: 380 pg/mL (ref 211–911)

## 2011-02-20 LAB — FOLATE: Folate: >20 ng/mL

## 2011-02-20 LAB — FERRITIN: Ferritin: 114 ng/mL (ref 10–291)

## 2011-02-21 LAB — POCT CARDIAC MARKERS
CKMB, poc: <1 ng/mL — ABNORMAL LOW (ref 1.0–8.0)
Myoglobin, poc: 100 ng/mL (ref 12–200)
Troponin i, poc: <0.05 ng/mL (ref 0.00–0.09)

## 2011-02-21 LAB — CBC
Hemoglobin: 11.2 g/dL — ABNORMAL LOW (ref 12.0–15.0)
MCHC: 35.4 g/dL (ref 30.0–36.0)
RDW: 15.4 % (ref 11.5–15.5)

## 2011-02-21 LAB — URINALYSIS, ROUTINE W REFLEX MICROSCOPIC
Bilirubin Urine: NEGATIVE
Ketones, ur: NEGATIVE mg/dL
Nitrite: NEGATIVE
Protein, ur: NEGATIVE mg/dL
Urobilinogen, UA: 0.2 mg/dL (ref 0.0–1.0)
pH: 5.5 (ref 5.0–8.0)

## 2011-02-21 LAB — DIFFERENTIAL
Eosinophils Absolute: 0.1 10*3/uL (ref 0.0–0.7)
Lymphs Abs: 1.6 10*3/uL (ref 0.7–4.0)
Monocytes Relative: 6 % (ref 3–12)
Neutro Abs: 1.6 10*3/uL — ABNORMAL LOW (ref 1.7–7.7)
Neutrophils Relative %: 46 % (ref 43–77)

## 2011-02-21 LAB — COMPREHENSIVE METABOLIC PANEL
ALT: 10 U/L (ref 0–35)
Calcium: 10.4 mg/dL (ref 8.4–10.5)
Glucose, Bld: 95 mg/dL (ref 70–99)
Sodium: 137 mEq/L (ref 135–145)
Total Protein: 8.2 g/dL (ref 6.0–8.3)

## 2011-03-28 NOTE — Discharge Summary (Signed)
NAME:  [name] [name], [name] [name]                    ACCOUNT NO.:  [id number]   MEDICAL RECORD NO.:  [id number]          PATIENT TYPE:  INP   LOCATION:  A311                          FACILITY:  [facility]   PHYSICIAN:  Osvaldo Shipper, MD     DATE OF BIRTH:  1945-12-07   DATE OF ADMISSION:  10/14/2007  DATE OF DISCHARGE:  12/04/2008LH                               DISCHARGE SUMMARY   PRIORITY DISCHARGE SUMMARY   Please see the H&P dictated by Dr. Elige Radon for details regarding  patient's presenting illness.   PRIMARY CARE PHYSICIAN:  Unknown at this time.   DISCHARGE DIAGNOSES:  1. Left foot cellulitis, improved.  2. Right knee effusion, drained.  3. Diabetes, type 2.  4. Hypertension.   BRIEF HOSPITAL COURSE:  Briefly, this is a 65 year old, African-American  female, who presented with complaints of left foot pain.  She was seen  in the emergency department and was found to have evidence for  cellulitis.  The patient was admitted to the hospital for IV  antibiotics.  The patient is a diabetic, which is a risk factor for  cellulitis.  Her white count was not really elevated when she was  admitted.  She was febrile.  The patient was put on IV Levaquin.  Her  left foot cellulitis improved dramatically over the next few days, and  today her symptoms were much better.  She was asked to take a p.o.  Levaquin for the next 12 days.   During the course of the hospitalization, she started complaining of  right knee pain.  The patient mentioned that she has a longstanding  history of arthritis in the knee, and she has been seen by Dr. Romeo Apple  in the past, who has drained her knee on multiple occasions.  So this  prompted a consultation with Dr. Romeo Apple, who did see her today, and I  was told that he did drain the knee and said it was okay for the patient  to be discharged after that.  The patient was otherwise medically stable  as well, and the only thing we were waiting on was for him to see her.  X-rays  of the knee were also obtained, which showed joint effusion and  osteoarthritis.   Her history of diabetes has remained stable.  Actually, her oral  hypoglycemic agents were held for some time because of poor p.o. intake.  She was asked to resume these when she went home.   Otherwise, she was also found to be mildly anemic, no overt blood loss  was noted.  She was also a little bit hypokalemic, which was also  corrected.   DISCHARGE MEDICATIONS:  1. She was put on Levaquin 500 mg daily for 12 days.   She was also to continue her home medications:  1. Lopid 600 mg b.i.d.  2. Glucophage 1000 mg b.i.d.  3. Glucotrol-XL 10 mg b.i.d.  4. Tylenol with Codeine as needed.  5. Pravachol 80 mg at bedtime.  6. Zestoretic 20/12.5 once a day.   FOLLOWUP:  1. She was asked to follow  up with her PMD for her diabetes care.  2. She was asked to follow with Dr. Romeo Apple as needed as determined      by him.   DIET:  Modified carbohydrate diet.   CONSULTATION:  Obtained by Dr. Romeo Apple.   PROCEDURES:  She underwent an arthrocentesis of the right knee, which  was done by Dr. Romeo Apple.   TOTAL TIME AT DISCHARGE:  35 minutes.      Osvaldo Shipper, MD  Electronically Signed     GK/MEDQ  D:  10/17/2007  T:  10/17/2007  Job:  [id number]   cc:   Vickki Hearing, M.D.  Fax: [phone]

## 2011-03-28 NOTE — Group Therapy Note (Signed)
NAME:  [name] [name], [name] [name]                    ACCOUNT NO.:  [id number]   MEDICAL RECORD NO.:  [id number]          PATIENT TYPE:  INP   LOCATION:  A311                          FACILITY:  [facility]   PHYSICIAN:  Dorris Singh, DO    DATE OF BIRTH:  06/19/1946   DATE OF PROCEDURE:  10/15/2007  DATE OF DISCHARGE:                                 PROGRESS NOTE   Patient seen this morning, stating that she feels better.  Her leg does  not hurt as much.  Last night, they checked her pulse oximetry, and the  nurse got a reading of 90.  The patient did not seem to have any  concerns of shortness of breath.  They rechecked her.  They put, I  think, her on O2, and she went up to 95.  They took her off, and on room  air she is around 95-96.  We will go ahead and order a chest x-ray today  to make sure that there is not any other source for her hypoxic event,  and we will see if it has been repeated, but currently she has been  pulse oxing very comfortably on room air, 93%-90%.   PHYSICAL EXAMINATION:  VITAL SIGNS:  T-max last night was 100, and  temperature currently is 97.3.  Await current vitals, but from last  night she is pretty stable.  GENERAL:  This is a 65 year old [place] American female who is well  developed, well nourished, in no acute distress.  She is pleasant and  answers questions appropriately.  HEART:  Regular rate and rhythm.  No murmurs, gallops, or rubs noted.  LUNGS:  Diminished breath sounds, but they sound clear to auscultation  bilaterally.  No wheezes, rales, or rhonchi.  ABDOMEN:  Soft, nontender, nondistended.  EXTREMITIES:  Her left foot does look markedly improved.   Her white count is 9.8, hemoglobin 10.2, hematocrit 29.7, and her  platelet count is 239.  Her chemistries:  Sodium 137, potassium which is  corrected from yesterday is 3.5, chloride 97, CO2 32, glucose 130, BUN  13, and creatinine 1.  Also, her wound culture is pending, and her blood  cultures are negative at  this point in time.   ASSESSMENT AND PLAN:  1. Cellulitis of the left foot.  2. Diabetes.  3. Hypertension.  4. Hypokalemia.  5. Hypoxia.   PLAN:  1. Cellulitis of the left foot.  Continue with current antibiotic      therapy IV with Levaquin.  She seems to be showing some      improvement.  Will continue to monitor with the demarcation around      the cellulitis.  2. Diabetes.  Keep the patient on the glycemic protocol.  Will      continue to monitor with a.c. and at bedtime checks and coverage.  3. Hypertension.  Do not have current vitals, but she is on her      hypertensive medication.  Will continue to monitor.  4. Hypoxia.  Will go ahead and get a chest x-ray today.  Will have  O2      if the patient needs it ordered at 2 liters.  Also, will have      prescribed handheld nebulizers for the patient as well.  Await      chest x-ray results, and will take further action if needed.      Anticipate discharge in 1-2 days.      Dorris Singh, DO  Electronically Signed     CB/MEDQ  D:  10/15/2007  T:  10/15/2007  Job:  [phone]

## 2011-03-28 NOTE — H&P (Signed)
NAME:  [name] [name], [name] [name]                    ACCOUNT NO.:  [id number]   MEDICAL RECORD NO.:  [id number]          PATIENT TYPE:  INP   LOCATION:  A311                          FACILITY:  [facility]   PHYSICIAN:  Dorris Singh, DO    DATE OF BIRTH:  1945/12/19   DATE OF ADMISSION:  10/14/2007  DATE OF DISCHARGE:  LH                              HISTORY & PHYSICAL   The patient is a 65 year old African American woman who presented to the  [facility] Emergency Room with the chief complaint of left foot  pain. She states that she has had a 2 to 3 day history of left foot pain  and right knee pain. Her right knee pain is chronic.  She has been  diagnosed with arthritis in that knee.  The foot pain shows an  increasing redness for one day and also swelling.  Denies any kind of  trauma to the left foot but had no relieving factors.   PAST MEDICAL HISTORY:  1. Hypertension.  2. Diabetes.  3. Arthritis.   FAMILY HISTORY:  Diabetes and hypertension.   PAST SURGICAL HISTORY:  1. Hysterectomy.  2. Breast lumpectomy.   SOCIAL HISTORY:  She is retired, nonsmoker, nondrinker, no drug abuse.   ALLERGIES:  PENICILLIN which causes hives.   CURRENT MEDICATIONS:  1. Lopid 600 mg b.i.d.  2. Glucophage 1000 mg b.i.d.  3. Glucotrol XL 10 mg b.i.d.  4. Tylenol with codeine #3 as needed.  5. Pravachol 80 mg at bedtime.  6. Zestoretic 20/12.5 mg a day.   REVIEW OF SYSTEMS:  GENERAL:  She denies any weakness, fatigue, or  changes in appetite.  EYES:  No problems with vision or blurred vision  or any discharge from her eyes.  ENT:  No changes in hearing, smelling.  No problems with her teeth or sore throat.  CARDIOVASCULAR:  No chest  pain or palpitations.  RESPIRATORY:  No dyspnea or shortness of breath  or wheezing.  GASTROINTESTINAL:  No nausea, vomiting, diarrhea, or  constipation.  GU:  Negative.  MUSCULOSKELETAL:  Positive for  arthralgias and arthritis.  SKIN: There is a left foot positive for  redness and swelling.  NEUROLOGIC:  No weakness or syncope.   PHYSICAL EXAMINATION:  VITAL SIGNS:  Temperature 100, pulse 93,  respirations 20, blood pressure 118/69.  GENERAL:  This is a 65 year old African American female who is well-  developed, well-nourished in no acute distress.  SKIN:  Positive redness on the lateral aspect of the left foot with a  swelling.  She has had good turgor and texture of the rest of her skin.  HEENT:  EOMI.  PERRLA.  Conjunctivae and lids are normal.  No scleral  icterus.  Ears show tympanic membranes are visualized bilaterally.  Nose  has no turbinate inflammation.  Throat has no erythema or exudate.  NECK:  Supple.  Full range of motion.  No trachea deviation.  HEART:  Regular rate and rhythm.  No murmurs, rubs or gallops.  LUNGS:  Clear to auscultation bilaterally.  No wheezes, rales or  rhonchi.  ABDOMEN:  Round, soft, nontender, nondistended.  No organomegaly noted.  No rigidity or rebound.  EXTREMITIES:  As decribed before with the left foot.  Right knee is  swollen but not red.  It looks like chronic changes.  NEUROLOGIC:  Cranial nerves II-XII grossly intact.   LABORATORY DATA:  Sodium 136, potassium 3, chloride 96, CO2 29, glucose  148, BUN 13, creatinine 1.01.  White count is 10.1, hemoglobin 10.9,  hematocrit 31.6, platelet count 256,000.   ASSESSMENT/PLAN:  1. Cellulitis of the left foot.  2. Diabetes.  3. Hypertension  4. Hypokalemia.   PLAN:  Admit her rule out 3A.  Give antibiotic therapy.  Blood cultures,  urine culture and sensitivity as well as antipyretics.  Glycemic  protocol.  Blood pressure medication.  Supplemental Kcl p.o. daily will  be given as well as three runs of IV Kcl.  DVT and GI prophylaxis and  anticipate discharge in 1 to 2 days.      Dorris Singh, DO  Electronically Signed     CB/MEDQ  D:  10/15/2007  T:  10/15/2007  Job:  [id number]

## 2011-03-28 NOTE — Discharge Summary (Signed)
NAME:  [name] [name], [name] [name]                    ACCOUNT NO.:  [id number]   MEDICAL RECORD NO.:  [id number]          PATIENT TYPE:  INP   LOCATION:  A309                          FACILITY:  [facility]   PHYSICIAN:  Vickki Hearing, M.D.DATE OF BIRTH:  July 12, 1946   DATE OF ADMISSION:  09/15/2008  DATE OF DISCHARGE:  11/06/2009LH                               DISCHARGE SUMMARY   ADMITTING DIAGNOSIS:  Osteoarthritis of the right knee.   DISCHARGE DIAGNOSIS:  Osteoarthritis of the right knee.   PROCEDURE:  Computer-assisted total knee arthroplasty with DePuy  posterior stabilized total knee system measured size 3 femur, size 3  tibia.  We put in a 10-mm insert with a 35-mm patellar button.   HISTORY:  A 65 year old female with osteoarthritis of the right knee  treated with anti-inflammatories injections and activity modification,  her pain increased and worsened.  Her activities became more difficult  and she agreed to undergo total knee replacement on the right.  She had  some abnormality in the distal femur and we opted to use a computer to  help [place] with alignment.   HOSPITAL COURSE:  The patient was admitted on September 15, 2008 and  underwent uncomplicated total knee replacement on September 15, 2008,  tolerated that well.  Postoperatively, her physical therapy went well,  had no complications.  Ambulated 130 feet, had 95 degrees range of  motion.   Her laboratory studies showed her hemoglobin stabilized at 8.4.  Her INR  was 1.9 on 2 mg Coumadin.  Glucose remained stable with a 116 level on  September 18, 2008.   Wound looked good.  Incisions looked good.  No drainage.   Pain pump catheter was removed on the day of discharge.   DISCHARGE MEDICATIONS:  The patient will resume the following  medications:  1. Coreg 25 mg daily.  2. Lopid 600 mg b.i.d.  3. Glucotrol 10 mg b.i.d.  4. Hydrochlorothiazide 12.5 mg p.o. daily.  5. Lisinopril 20 mg daily.  6. Metformin 1000 mg b.i.d.  7. Protonix 40  mg q. 12.  8. Protonix 40 mg daily.  9. Pravachol 40 mg in the next evening at 6 p.m.  10.She should take Coumadin 1 mg 2 tablets every evening at 6 p.m.  11.She will be on Norco 5/325 one q. 4 p.r.n. for pain, will be      discharged with 90 tablets with 2 refills.   She will be discharged with a CPM machine, physical therapy 3-5 times a  week, followup appointment on September 29, 2008.   Disposition is home discharge and overall condition is improved.      Vickki Hearing, M.D.  Electronically Signed     SEH/MEDQ  D:  09/18/2008  T:  09/18/2008  Job:  [id number]

## 2011-03-28 NOTE — Op Note (Signed)
NAME:  [name] [name], [name] [name]                    ACCOUNT NO.:  [id number]   MEDICAL RECORD NO.:  [id number]          PATIENT TYPE:  AMB   LOCATION:  DAY                           FACILITY:  [facility]   PHYSICIAN:  Kassie Mends, M.D.      DATE OF BIRTH:  1945-12-22   DATE OF PROCEDURE:  12/13/2007  DATE OF DISCHARGE:                               OPERATIVE REPORT   REFERRING PHYSICIAN:  Dr. Reynolds Bowl.   PROCEDURE:  Colonoscopy.   INDICATIONS:  [name] is a 65 year old female who presents for average  risk for colon cancer screening.   FINDINGS:  1. Normal colon without evidence of polyps, mass, diverticula,      inflammatory changes or AVMs.  2. Normal retroflexed view of the rectum.   RECOMMENDATIONS:  1. She should follow high fiber diet.  She is given a handout on high-      fiber diet.  2. Will check a CBC today.  If she is anemic, then will consider      performing an upper endoscopy.  Will call [name] with further      instructions.  3. Screening colonoscopy in 10 years.  4. She is asked to drink plenty of fluids today.   PROCEDURE TECHNIQUE:  Physical exam was performed.  Informed consent was  obtained from the patient after explaining the benefits, risks and  alternatives to the procedure.  The patient was connected to the monitor  and placed in the left lateral position.  Continuous oxygen was provided  by nasal cannula and IV medicine administered through an indwelling  cannula.  After administration of sedation and rectal exam, the  patient's rectum was entered and the scope was advanced under direct  visualization to the cecum.  Scope was removed slowly by carefully  examining the color, texture, anatomy, and integrity of the mucosa on  the way out.  The patient was recovered in endoscopy discharged home in  satisfactory condition.      Kassie Mends, M.D.  Electronically Signed     SM/MEDQ  D:  12/16/2007  T:  12/16/2007  Job:  [id number]

## 2011-03-28 NOTE — Assessment & Plan Note (Signed)
NAME:  [name] [name], [name] [name]                     CHART#:  [id number]   DATE:  07/15/2008                       DOB:  1946-02-15   REFERRING PHYSICIAN:  Dr. Reynolds Bowl.   PROBLEM LIST:  1. 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  2. Colonoscopy in 2009 with no evidence of polyps.  3. Diabetes.  4. Hypertension.  5. Left foot cellulitis, requiring hospitalization in November 2008.  6. Arthritis, requiring outpatient use of diclofenac.  7. Mild anemia in May 2009 with a hemoglobin of 11.1 and a ferritin of      164, while taking iron supplementation.   SUBJECTIVE:  Ms. [name] [name] is a 65 year old female who presents as a return  patient visit.  She has not seen any black tarry stools or rectal  bleeding.  She does not have any particular questions, concerns, or  complaints.  Her appetite has been pretty good.  She does not eat meat  daily.  She may sometime go an entire week without eating meat.  She  takes aspirin daily.  She denies any BC powder or Goody's powder use,  ibuprofen, Motrin, or Aleve.   MEDICATIONS:  1. Lisinopril/hydrochlorothiazide.  2. Metformin.  3. Pravastatin.  4. Tylenol.  5. Omeprazole 20 mg daily.  6. Hydrocodone/APAP.  7. Colace as needed.  8. Carvedilol 12.5 mg daily.  9. Gemfibrozil 600 mg b.i.d.  10.Baby aspirin 81 mg daily.   OBJECTIVE:   PHYSICAL EXAMINATION:  VITAL SIGNS:  Weight 198.5 pounds (up 6 pounds  since May 2009), height 5 feet 7 inches, BMI 31 (obese), temperature  97.5, blood pressure 124/88, and pulse 16.  GENERAL:  She is in no apparent distress, alert, and oriented x4.LUNGS:  Clear to auscultation bilaterally.CARDIOVASCULAR:  Regular rhythm.  ABDOMEN:  Bowel sounds present, soft, nontender, and nondistended.   ASSESSMENT:  Ms. [name] [name] is a 65 year old female who had a mild anemia in  May 2009 while taking iron supplementation and she does not eat meat  regularly. Thank you for allowing me to see Ms. [name] [name] in consultation.  My  recommendations follow.   RECOMMENDATIONS:  1. She has already had an upper endoscopy and colonoscopy.  Will check      a hemoglobin, hematocrit, and ferritin.  If she has evidence of      iron deficiency, then she will need a capsule endoscopy to complete      her evaluation of her GI tract.  2. Follow up in 3 months.       Kassie Mends, M.D.  Electronically Signed     SM/MEDQ  D:  07/15/2008  T:  07/15/2008  Job:  [id number]   cc:   Dr. Reynolds Bowl

## 2011-03-28 NOTE — Op Note (Signed)
NAME:  [name] [name], [name] [name]                    ACCOUNT NO.:  [id number]   MEDICAL RECORD NO.:  [id number]          PATIENT TYPE:  INP   LOCATION:  A309                          FACILITY:  [facility]   PHYSICIAN:  Vickki Hearing, M.D.DATE OF BIRTH:  30-Jul-1946   DATE OF PROCEDURE:  09/15/2008  DATE OF DISCHARGE:                               OPERATIVE REPORT   CHIEF COMPLAINT:  Pain, right knee.   HISTORY:  She is a 65 year old female who had osteoarthritis of the  right knee.  She was treated with anti-inflammatories, injections,  activity modification.  Her pain increased and worsened, her activities  became more difficult, and she presented for right total knee  arthroplasty with computer assistance.   PREOPERATIVE DIAGNOSIS:  Osteoarthritis, right knee.   POSTOPERATIVE DIAGNOSIS:  Osteoarthritis, right knee.   PROCEDURE:  Computer-assisted total knee arthroplasty.   SURGEON:  Vickki Hearing, MD   ASSISTED BY:  Margaree Mackintosh and Cushman Nation.   ANESTHESIA:  Spinal.   OPERATIVE FINDINGS:  There was a severe wear on the medial side,  moderate wear on the lateral side.  There was deformity of the femur  with the distal femur in valgus, the proximal tibia in varus.  There was  deformity of proximal tibia as well.  There was severe medial defect in  the proximal tibia.  Overall, alignment was significant and severe  valgus.   SPECIMENS:  None.   ESTIMATED BLOOD LOSS:  Minimal.   COMPLICATIONS:  There were no complications.   COUNTS:  Correct.   The patient was identified in the preop area as [name] [name].  Her right  knee was marked as the surgical site by the patient and countersigned by  the surgeon.  The history and physical was updated.  The patient was  started on vancomycin per protocol within 2 hours of skin incision.  She  was taken to surgery, had a spinal anesthetic after insertion of a  sterile Foley catheter.   The right leg was prepped and draped sterilely with  DuraPrep and sterile  draping technique.  Time-out procedure was completed.   The limb was exsanguinated with a 6-inch Esmarch.  Tourniquet was  inflated with the leg in flexion.  Straight midline incision was made  with the knee in flexion.  Subcutaneous tissue was divided down to the  extensor mechanism, and a medial arthrotomy was performed.  The patella  was everted.  Soft tissue was resected as needed for exposure including  a synovectomy.  Medial soft tissue sleeve was elevated to the mid  coronal plane of the tibia.  Patella fat pad was excised, and the  patellofemoral ligament was released.   Two pins were placed proximally for the computer registering.   Two were placed distally through the tibia through 2 stab wounds on the  tibial shaft.   Registration was then performed.   Tibial cutting block was then set for neutral cut with 11-mm resection  from the high lateral side.  This cut was made with an oscillating saw,  was confirmed by computer  registration with a verify.   Distal femoral cut was made using a 5-degree valgus cut.   This block was pinned and then cut was made and extension gap was  checked, found to be balanced with a 10-mm spacer block.   We then used the femoral sizing guide and pinned the femoral cutting  block in 3 degrees external rotation to match white sides line and the  epicondyles.   Those 4 cuts were made.  An osteotome was used to resect the posterior  osteophytes in the femur and the flexion gap was then checked.   The knee was then taken through a range of motion with the trial  implants in place after the box cut was made.  Tibial tray was marked  for rotation.   The patella was skeletonized and resected from a 23-mm thickness to a 15-  mm thickness and a 35 button was then drilled.  We then punched the  tibial punch and then we prepared for cementing.   Lug holes were drilled in the femoral trial before it was removed.   Range of  motion was plus 4 degrees in extension giving a slight  hyperextension and flexion was 125 degrees.   The implants were then cemented in place, allowed to cure, verification  with computer was then finalized with a 0-degree overall alignment on  the mechanical axis.  Gaps were also checked and were balanced.   Excess cement was removed.  Knee was irrigated.  Knee was injected with  30 mL of 0.5% Sensorcaine with epinephrine.  Capsule was closed in  flexion with #1 interrupted and running Bralon.  Pain pump catheter was  placed in subcu tissue.  Subcu tissue was closed with 0 Monocryl and  staples.  Distal tibial stab wounds were closed with 3-0 nylon staples.   Radiograph was taken and showed excellent alignment of the prosthesis  and implants were in excellent position.   Postop plan is for Coumadin for DVT prophylaxis, CPM machine, physical  therapy standard protocol.      Vickki Hearing, M.D.  Electronically Signed     SEH/MEDQ  D:  09/15/2008  T:  09/15/2008  Job:  [id number]

## 2011-03-28 NOTE — Consult Note (Signed)
NAME:  [name], [name]                    ACCOUNT NO.:  [id number]   MEDICAL RECORD NO.:  [id number]          PATIENT TYPE:  DAY   LOCATION:                                FACILITY:  [facility]   PHYSICIAN:  Kassie Mends, M.D.      DATE OF BIRTH:  Sep 24, 1946   DATE OF CONSULTATION:  DATE OF DISCHARGE:                                 CONSULTATION   CHIEF COMPLAINT:  Hemoccult positive stool.   HISTORY OF PRESENT ILLNESS:  [name] [name] is a 65 year old female.  She is  referred through the [facility].  She presents  today telling me that she was found to be hemoccult positive on stool  cards.  She denies having seen any blood on the toilet tissue, although  interestingly it is noted on notes from the Health Department from  10/02/2007.  Nonetheless she denies any history of rectal bleeding or  melena.  She denies any abdominal pain.  Denies any anorexia or early  satiety.  She has had some very mild transient nausea, but has not  vomited denies any heartburn ingestion, denies any dysphagia or  odynophagia.  Denies any diarrhea or constipation.  She has lost about  14 pounds in the last 2 months, but attributes this to decreased caloric  intake intentionally.  She denies any history of anemia.   PAST MEDICAL AND SURGICAL HISTORY:  Diabetes mellitus, hypertension.  She had left foot cellulitis and was hospitalized in November 2008.  She  has history of right knee arthritis, benign breast cyst and  hysterectomy. She had previously been on diclofenac. This has since been  discontinued.   CURRENT MEDICATIONS:  1. Lisinopril/HCTZ 20/5 mg daily.  2. Glipizide ER 10 mg b.i.d.  3. Metformin 1 gram b.i.d.  4. Pravastatin 80 mg q.h.s.  5. Tylenol.   ALLERGIES:  PENICILLIN.   FAMILY HISTORY:  There is no known family history of colorectal  carcinoma, liver or chronic GI problems.  Both parents are deceased.  Mother died age 23 due to old age.  Father age 18 with coronary artery  disease.  She has seven healthy siblings.   SOCIAL HISTORY:  She is a widow.  She has three healthy children.  She  is retired from [employer].  She denies tobacco, alcohol or drug  use.   REVIEW OF SYSTEMS:  See HPI.  MUSCULOSKELETAL:  She does have some right  shoulder pain.  Otherwise negative review of systems.   PHYSICAL EXAMINATION:  VITAL SIGNS:  Weight 203 pounds, height 67  inches, temperature 98.6, blood pressure 120/80, pulse 92.  GENERAL:  She is a well-developed, well-nourished overweight [place] American  female who is alert, oriented, pleasant, cooperative and in no distress.  HEENT:  Clear nonicteric.  Conjunctivae pink.  Oropharynx pink and  moist.  She has 2+ tonsils.  NECK:  Neck is supple without thyromegaly.  CHEST:  Heart regular rate with normal S1-S2. No murmurs or rubs or  gallops.  LUNGS:  Clear to auscultation bilaterally.  ABDOMEN: Positive bowel sounds x4.  No bruits auscultated.  Soft,  nontender, nondistended, without palpable mass or hepatomegaly.  No  rashes or guarding.  Exam is limited given the patient's body habitus.   X-RAYS:  Without clubbing or edema bilaterally.   IMPRESSION:  [name] [name] is a 65 year old female found to be hemoccult  positive on stool cards. She has never had a colonoscopy and this needs  to be performed to rule out occult colorectal carcinoma.  She denies any  upper GI complaints at this time.  There is no known history of anemia  per her report.   PLAN:  Colonoscopy with Dr. Cira Servant in the near future.  I have discussed  procedure including risks which include but are not limited to  infection, perforation, drug reaction.  She agrees with the plan,  consent has been obtained.  She is to take half of her glipizide and a  metformin the day prior to having the procedure.   Thank you Dr. Jorene Guest for allowing [place] to participate in the care of Ms.  [name].      Lorenza Burton, N.P.      Kassie Mends, M.D.   Electronically Signed    KJ/MEDQ  D:  12/11/2007  T:  12/12/2007  Job:  [id number]   cc:   Lorenza Burton, N.P.   Kassie Mends, M.D.  [address]  [place] , [place] [address]

## 2011-03-28 NOTE — Op Note (Signed)
NAME:  [name] [name], [name] [name]                    ACCOUNT NO.:  [id number]   MEDICAL RECORD NO.:  [id number]          PATIENT TYPE:  AMB   LOCATION:  DAY                           FACILITY:  [facility]   PHYSICIAN:  Kassie Mends, M.D.      DATE OF BIRTH:  1946-10-08   DATE OF PROCEDURE:  DATE OF DISCHARGE:                               OPERATIVE REPORT   PROCEDURE:  Esophagogastroduodenoscopy with cold forceps biopsy.   INDICATIONS FOR EXAM:  [name] [name] is a 65 year old female who initially  presented with heme positive stool.  She was seen and evaluated for  colonoscopy.  A CBC was drawn on that day which showed a white count of  11.5, a hemoglobin of 9.9, with an MCV of 84.8 and a platelet count of  190.  Her creatinine is normal.  The upper endoscopy is being performed  to further evaluate her heme positive stools and anemia.   FINDINGS:  1. Normal esophagus without evidence of Barrett's, mass, erosion,      ulceration or stricture.  2. There are multiple erosions seen in the antrum at the level of the      angularis.  Evidence of active oozing noted with streaks of old      blood.  Biopsies were obtained via cold forceps to evaluate for H.      Pylori gastritis.  3. Normal duodenal bulb and second portion of the duodenum.   DIAGNOSIS:  [name] [name] probably has anemia, secondary to multiple antral  erosions.   RECOMMENDATIONS:  1. She should add new iron 150 mg daily.  2. We will add Prilosec 20 mg daily.  3. She should avoid aspirin and antiinflammatory drugs for 30 days.  4. She may resume her previous diet, but avoid gastric irritants.  She      is given a handout on gastric irritants and gastritis.  5. She has a follow-up appointment to see me in 3 months to re-      evaluate her for anemia.  If after completing treatment for      gastritis her anemia persists then I would complete the evaluation      of her GI tract with a capsule endoscopy.   MEDICATIONS:  1. Demerol 100 mg IV.  2. Versed 7  mg IV.   PROCEDURE AND TECHNIQUE:  A physical exam was performed and an informed  consent was obtained from the patient after explaining the benefits,  risks, and alternatives to the procedure.  The patient was attached to  the monitor and placed in the left lateral position.  Continuous oxygen  was provided by nasal cannula and IV medicine administered through an  indwelling cannula.  After the administration of sedation, the patient's  esophagus was  intubated and the scope was advanced under direct visualization to the  second portion of the duodenum.  The scope was removed slowly back down  to examine the texture, anatomy, and integrity of the mucosa on the way  out.  The patient was recovered in the endoscopy room and discharged  home in satisfactory condition.      Kassie Mends, M.D.  Electronically Signed     SM/MEDQ  D:  12/26/2007  T:  12/27/2007  Job:  [id number]   cc:   Reynolds Bowl, M.D.

## 2011-03-28 NOTE — Consult Note (Signed)
NAME:  [name] [name], [name] [name]                    ACCOUNT NO.:  [id number]   MEDICAL RECORD NO.:  [id number]          PATIENT TYPE:  INP   LOCATION:  A311                          FACILITY:  [facility]   PHYSICIAN:  Vickki Hearing, M.D.DATE OF BIRTH:  [birth year]   DATE OF CONSULTATION:  DATE OF DISCHARGE:  10/17/2007                                 CONSULTATION   CHIEF COMPLAINT:  Swelling of the right knee.   HISTORY:  A 65 year old, African-American lady presented with a chief  complaint of left foot pain to the emergency room and was admitted by  Dorris Singh of the hospitalist service on December 1. She was  treated for cellulitis of the left foot successfully. During the  hospitalization, the patient developed chronic right knee pain.   A consultation was obtained to evaluate.   The patient is well-known to me with a history of chronic knee swelling,  some degenerative change in the right knee. X-rays do not look as bad as  her knee has been dealing in terms of the amount of swelling that she  has had.   She has history of hypertension, diabetes, and arthritis.   She complains of pain, swelling and loss of motion of the right knee.   FAMILY HISTORY:  Diabetes, hypertension.   PAST SURGICAL HISTORY:  Breast lumpectomy, hysterectomy.   SOCIAL HISTORY:  She is retired, nonsmoker, nondrinker.  No drug abuse.   The patient is allergic to PENICILLIN which causes hives.   She is on Lopid, Glucophage, Glucotrol, Tylenol with Codeine p.r.n.,  Pravachol, Zestoretic.   REVIEW OF SYSTEMS:  The left foot has improved.  She denies fever at  this time.  She generally feels pretty good.  She indicates that if I  can aspirate the fluid she can go home.   VITAL SIGNS:  Presently are temperature 98.9, pulse 87, respiratory rate  20, systolic blood pressure 114, diastolic 73, room air sat 95%,  CBG  138 at 12 o'clock.  GENERAL APPEARANCE:  The patient is well-developed and nourished,  grooming  and hygiene are normal.  My examination is confined specifically to her right lower extremity.  She was awake, alert and oriented x3, cooperated with the exam and had  normal mental status and mood.  Right knee shows a significant large joint effusion with the range of  motion of 0 to 40. She is tender around the suprapatellar pouch where  there is a large amount of fluid noted. Her knee is stable.  She does  have diffuse tenderness.  Her muscular exam is normal with good muscle tone and normal function in  terms of dorsiflexion and plantar flexion of the foot. Good extension  power is noted.  There is no subluxation or atrophy of the muscles.  Sensation remained intact in the right lower extremity, good pulses in  the right lower extremity.  No peripheral edema.   Radiograph shows joint effusion and some osteoarthritis, worse in the  medial compartment with subchondral sclerosis.   LABORATORY STUDIES:  Basic metabolic panel, sodium was 133, glucose 142.  All other parameters were normal.  CBC was white count 9.4, neutrophils  81%.   IMPRESSION:  1. Right knee effusion.  2. Underlying osteoarthritis.  3. Knee pain.   PLAN:  Aspiration injection.   PROCEDURE NOTE:   DIAGNOSIS:  Right knee effusion.   PLAN:  Aspiration injection   Under sterile conditions, the right knee was aspirated 120 mL of yellow  fluid, there was no purulence.   We injected Depo-Medrol 40 mg 1 mL and lidocaine 3 mL 1% tolerated well  without complication. Sterile bandage applied.   PLAN:  The patient can be discharged home per the medical department.  Follow-up with me as needed.   INSTRUCTIONS FOR HER KNEE:  Use ice to control swelling, weightbear as  tolerated. Any problems  she can set up an appointment in our office.      Vickki Hearing, M.D.  Electronically Signed     SEH/MEDQ  D:  10/17/2007  T:  10/18/2007  Job:  [id number]

## 2011-03-28 NOTE — Assessment & Plan Note (Signed)
NAME:  Milroy, Opel L                     CHART#:  [id number]   DATE:  10/27/2008                       DOB:  August 08, 1946   REFERRING PHYSICIAN:  Reynolds Bowl, MD   PROBLEM LIST:  1. 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  2. Colonoscopy in 2009 with no polyps.  3. Diabetes.  4. Hypertension.  5. Left foot cellulitis, requiring hospitalization in November 2008.  6. Arthritis requiring outpatient use of diclofenac.  7. Mild anemia in May 2009 with hemoglobin of 11.1 and ferritin of 164      (no iron).   SUBJECTIVE:  Ms. Pellicano is a 65 year old female who presents as a return  patient visit.  She was last seen in September 2009.  She was not taking  iron.  She had a hemoglobin, hematocrit, and a ferritin checked and they  were all within normal limits.  Hemoglobin was 12.1, hematocrit 37, and  ferritin 190.  She has no particular questions, concerns, or complaints.  She occasionally has heartburn and indigestion.  She denies any bright  red blood per rectum or black tarry stools.  Appetite is pretty good.  She is now eating meat daily.  She has a bowel movement daily without  straining.  She has no family history of colon cancer or colon polyps.   MEDICATIONS:  1. Lisinopril/HCTZ 20/12.5 daily.  2. Metformin 1 g b.i.d.  3. Pravastatin.  4. Tylenol as needed.  5. Omeprazole 20 mg daily.  6. Hydrocodone/APAP as needed.  7. Colace as needed.  8. Fiber 2 times a week.  9. Carvedilol 12.5 mg daily.  10.Gemfibrozil 600 mg b.i.d.  11.Baby aspirin daily.   OBJECTIVE:  VITAL SIGNS:  Weight 197 pounds (down 6 pounds since January  2009), height 5 feet 7 inches, temperature 98, blood pressure 110/70,  pulse 68, and BMI 30.9 (obese).  GENERAL:  She is in no apparent distress.  Alert and oriented x4.LUNGS:  Clear to auscultation bilaterally.CARDIOVASCULAR:  Regular rhythm.  No  murmur.  Normal S1 and S2.ABDOMEN:  Bowel sounds are present.  Soft,  nontender, and  nondistended.  No rebound or guarding.   ASSESSMENT:  Ms. Holecek is a 65 year old female who has gastroesophageal  reflux disease, which is fairly well controlled on omeprazole.  She also  had reactive gastropathy and is no longer noted to be taking diclofenac.  Thank you for allowing me to see Ms. Mechling in consultation.  My  recommendations follow.   RECOMMENDATIONS:  1. Screening colonoscopy in 2019.  2. She should follow a low-fat diet.  I did recommend her that she      lose 10-20 pounds for multiple health reasons.  She is also given a      handout on lifestyle recommendations for patients with GERD.  3. She should continue omeprazole indefinitely.  4. She may follow up with me as needed.       Kassie Mends, M.D.  Electronically Signed     SM/MEDQ  D:  10/27/2008  T:  10/28/2008  Job:  [id number]   cc:   Reynolds Bowl, MD

## 2011-03-31 NOTE — Assessment & Plan Note (Signed)
NAME:  Calef, Kitiara L                     CHART#:  [id number]   DATE:  03/20/2008                       DOB:  1946/08/12   PROBLEM LIST:  1. 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  2. Colonoscopy in 2009 with no evidence of polyps.  3. Diabetes next hypertension.  4. History of left foot cellulitis requiring hospitalization in      November 2008.  5. Arthritis requiring the outpatient use of diclofenac.   SUBJECTIVE:  Ms. Geng is a 65 year old female who presents as a return  patient visit.  She denies any rectal bleeding or constipation.  She  continues on the iron and the omeprazole daily.   MEDICATIONS:  1. Lisinopril/HCTZ.  2. Glipizide ER  3. Metformin XR.  4. Pravastatin.  5. Tylenol as needed.  6. Ferritin 150 mg daily.  7. Omeprazole 20 mg daily.  8. Hydrocodone as needed.  9. Colace as needed.  10.Fiber once a week.   OBJECTIVE:  VITAL SIGNS:  Weight 192 pounds (down 11 pounds since  January 2009), height 5 feet 7 inches, temperature 98.3, blood pressure  118/82, pulse 64.  GENERAL:  She is in no apparent distress.  Alert and oriented x4.  LUNGS:  Clear to auscultation bilaterally.  CARDIOVASCULAR:  Regular  rhythm, no murmur.  ABDOMEN:  Bowel sounds present.  Soft, nontender, nondistended.  No  rebound or guarding   ASSESSMENT:  Ms. Nilson is a 65 year old female who has a history of a  normocytic anemia while taking diclofenac.  It was also associated with  heme-positive stools.  A colonoscopy in February 2009 showed no evidence  of polyps.   Thank you allowing me to see Ms. Coppedge in consultation.  My  recommendations follow.   RECOMMENDATIONS:  Will check the hemoglobin, hematocrit, TIBC and  ferritin on today.  If she has evidence of iron deficiency anemia, then  she should have a capsule endoscopy to complete her evaluation.  If she  does not have any evidence of iron-deficiency anemia, we will  stop her iron.  She will also have repeat hemoglobin and  hematocrit in 3  months with her return patient visit.       Kassie Mends, M.D.  Electronically Signed     SM/MEDQ  D:  03/20/2008  T:  03/20/2008  Job:  [id number]

## 2011-08-03 LAB — CBC
RBC: 3.39 — ABNORMAL LOW
WBC: 11.5 — ABNORMAL HIGH

## 2011-08-14 LAB — CROSSMATCH
ABO/RH(D): O POS
Antibody Screen: NEGATIVE

## 2011-08-14 LAB — APTT: aPTT: 44 — ABNORMAL HIGH

## 2011-08-14 LAB — ABO/RH: ABO/RH(D): O POS

## 2011-08-14 LAB — CBC
HCT: 35.1 — ABNORMAL LOW
MCV: 88.8
RBC: 3.95
WBC: 3.4 — ABNORMAL LOW

## 2011-08-14 LAB — BASIC METABOLIC PANEL
Chloride: 107
GFR calc Af Amer: 53 — ABNORMAL LOW
Potassium: 4.3

## 2011-08-14 LAB — PROTIME-INR: Prothrombin Time: 12.7

## 2011-08-15 LAB — PROTIME-INR
INR: 1.1
INR: 1.9 — ABNORMAL HIGH
Prothrombin Time: 21.2 — ABNORMAL HIGH
Prothrombin Time: 23.1 — ABNORMAL HIGH

## 2011-08-15 LAB — BASIC METABOLIC PANEL
BUN: 12
BUN: 12
CO2: 25
Chloride: 105
Creatinine, Ser: 1.05
GFR calc Af Amer: 56 — ABNORMAL LOW
GFR calc non Af Amer: 49 — ABNORMAL LOW
GFR calc non Af Amer: 53 — ABNORMAL LOW
Glucose, Bld: 101 — ABNORMAL HIGH
Glucose, Bld: 116 — ABNORMAL HIGH
Potassium: 3.9
Potassium: 4
Potassium: 4.2

## 2011-08-15 LAB — DIFFERENTIAL
Basophils Absolute: 0
Basophils Absolute: 0
Basophils Relative: 0
Eosinophils Absolute: 0.1
Eosinophils Absolute: 0.1
Eosinophils Absolute: 0.2
Eosinophils Relative: 1
Eosinophils Relative: 1
Eosinophils Relative: 2
Lymphocytes Relative: 12
Lymphocytes Relative: 15
Lymphocytes Relative: 20
Lymphs Abs: 0.9
Lymphs Abs: 1.5
Monocytes Absolute: 0.7
Monocytes Relative: 8
Neutrophils Relative %: 71
Neutrophils Relative %: 79 — ABNORMAL HIGH

## 2011-08-15 LAB — CBC
HCT: 23.9 — ABNORMAL LOW
HCT: 24.5 — ABNORMAL LOW
HCT: 27.7 — ABNORMAL LOW
Hemoglobin: 8.5 — ABNORMAL LOW
MCHC: 34.7
MCV: 88.4
MCV: 89
MCV: 89.4
Platelets: 176
Platelets: 177
RBC: 3.11 — ABNORMAL LOW
RDW: 13
RDW: 13.1
WBC: 7.6
WBC: 7.9

## 2011-08-15 LAB — GLUCOSE, CAPILLARY
Glucose-Capillary: 111 — ABNORMAL HIGH
Glucose-Capillary: 121 — ABNORMAL HIGH
Glucose-Capillary: 159 — ABNORMAL HIGH
Glucose-Capillary: 95

## 2011-08-21 LAB — BASIC METABOLIC PANEL
BUN: 11
BUN: 13
BUN: 13
CO2: 29
CO2: 32
Calcium: 9.5
Calcium: 9.6
Chloride: 96
Chloride: 96
Chloride: 97
Creatinine, Ser: 1
Creatinine, Ser: 1.01
Potassium: 3.7

## 2011-08-21 LAB — DIFFERENTIAL
Basophils Absolute: 0
Basophils Absolute: 0
Basophils Relative: 0
Basophils Relative: 0
Eosinophils Absolute: 0 — ABNORMAL LOW
Eosinophils Absolute: 0 — ABNORMAL LOW
Eosinophils Absolute: 0 — ABNORMAL LOW
Eosinophils Relative: 0
Lymphs Abs: 1
Monocytes Relative: 8
Neutro Abs: 8.3 — ABNORMAL HIGH
Neutrophils Relative %: 75
Neutrophils Relative %: 83 — ABNORMAL HIGH

## 2011-08-21 LAB — URINE CULTURE

## 2011-08-21 LAB — CBC
HCT: 29.1 — ABNORMAL LOW
MCHC: 34.4
MCHC: 34.5
MCV: 85.8
MCV: 86.6
MCV: 86.7
Platelets: 238
Platelets: 256
Platelets: 267
RDW: 13.6
WBC: 9.4

## 2011-08-21 LAB — URINALYSIS, ROUTINE W REFLEX MICROSCOPIC
Hgb urine dipstick: NEGATIVE
Nitrite: NEGATIVE
Protein, ur: NEGATIVE
Urobilinogen, UA: 0.2

## 2011-08-21 LAB — WOUND CULTURE

## 2011-08-21 LAB — CULTURE, BLOOD (ROUTINE X 2): Report Status: 12072008

## 2011-09-11 ENCOUNTER — Encounter (HOSPITAL_COMMUNITY): Payer: Medicare Other | Attending: Oncology

## 2011-09-11 ENCOUNTER — Encounter (HOSPITAL_COMMUNITY): Payer: Self-pay

## 2011-09-11 DIAGNOSIS — D649 Anemia, unspecified: Secondary | ICD-10-CM

## 2011-09-11 DIAGNOSIS — I10 Essential (primary) hypertension: Secondary | ICD-10-CM | POA: Insufficient documentation

## 2011-09-11 DIAGNOSIS — M109 Gout, unspecified: Secondary | ICD-10-CM | POA: Insufficient documentation

## 2011-09-11 DIAGNOSIS — E119 Type 2 diabetes mellitus without complications: Secondary | ICD-10-CM | POA: Insufficient documentation

## 2011-09-11 LAB — DIFFERENTIAL
Basophils Absolute: 0.1 10*3/uL (ref 0.0–0.1)
Basophils Relative: 1 % (ref 0–1)
Eosinophils Absolute: 0.2 10*3/uL (ref 0.0–0.7)
Eosinophils Relative: 5 % (ref 0–5)

## 2011-09-11 LAB — CBC
MCH: 31.4 pg (ref 26.0–34.0)
MCHC: 33.7 g/dL (ref 30.0–36.0)
MCV: 93.1 fL (ref 78.0–100.0)
Platelets: 209 10*3/uL (ref 150–400)
RDW: 13.6 % (ref 11.5–15.5)
WBC: 3.8 10*3/uL — ABNORMAL LOW (ref 4.0–10.5)

## 2011-09-11 NOTE — Progress Notes (Signed)
Labs drawn today for cbc/diff 

## 2011-09-12 ENCOUNTER — Ambulatory Visit (HOSPITAL_COMMUNITY): Payer: Medicare Other | Admitting: Oncology

## 2011-12-06 ENCOUNTER — Other Ambulatory Visit (HOSPITAL_COMMUNITY): Payer: Self-pay | Admitting: Family Medicine

## 2011-12-06 DIAGNOSIS — Z139 Encounter for screening, unspecified: Secondary | ICD-10-CM

## 2011-12-12 ENCOUNTER — Encounter: Payer: Self-pay | Admitting: Orthopedic Surgery

## 2011-12-12 ENCOUNTER — Ambulatory Visit (INDEPENDENT_AMBULATORY_CARE_PROVIDER_SITE_OTHER): Payer: Medicare Other | Admitting: Orthopedic Surgery

## 2011-12-12 VITALS — Ht 67.25 in | Wt 220.0 lb

## 2011-12-12 DIAGNOSIS — Z96659 Presence of unspecified artificial knee joint: Secondary | ICD-10-CM

## 2011-12-12 NOTE — Patient Instructions (Signed)
THE IMPLANT IS FUNCTIONING WELL PLEASE COME BACK IN A  YEAR FOR YOUR ANNUAL X RAY

## 2011-12-12 NOTE — Progress Notes (Signed)
Patient ID: [name] [name], female   DOB: 05/12/1946, 66 y.o.   MRN: [id number] Routine follow up   Chief complaint total knee follow-up.  History this is a follow-up visit. Status post  Right  total knee replacement.  DOS 09/15/2008  Implant DePuy  Review of systems patient has no complaints.  Exam Physical Exam(6) GENERAL: normal development   CDV: pulses are normal   Skin: normal  Psychiatric: awake, alert and oriented  Neuro: normal sensation  1 ambulation no supportive devices and no limp 2 ROM = 125 3 Motor normal  4 Stability normal   Separate x-ray report.  Reason for x-ray, and we'll x-ray follow-up knee replacement.  3 views  Right  knee.  The implant is aligned normally. There is no loosening.  Impression normal appearing knee replacement.    Assessment: Knee replacement functioning well    Plan: One year follow

## 2011-12-18 ENCOUNTER — Ambulatory Visit (HOSPITAL_COMMUNITY)
Admission: RE | Admit: 2011-12-18 | Discharge: 2011-12-18 | Disposition: A | Discharge status: Home / Self Care | Payer: Medicare Other | Source: Ambulatory Visit | Attending: Family Medicine | Admitting: Family Medicine

## 2011-12-18 DIAGNOSIS — Z1231 Encounter for screening mammogram for malignant neoplasm of breast: Secondary | ICD-10-CM | POA: Insufficient documentation

## 2011-12-18 DIAGNOSIS — Z139 Encounter for screening, unspecified: Secondary | ICD-10-CM

## 2012-06-04 ENCOUNTER — Emergency Department (HOSPITAL_COMMUNITY)
Admission: EM | Admit: 2012-06-04 | Discharge: 2012-06-05 | Disposition: A | Discharge status: Home / Self Care | Payer: Medicare Other | Attending: Emergency Medicine | Admitting: Emergency Medicine

## 2012-06-04 ENCOUNTER — Encounter (HOSPITAL_COMMUNITY): Payer: Self-pay | Admitting: *Deleted

## 2012-06-04 DIAGNOSIS — Z90710 Acquired absence of both cervix and uterus: Secondary | ICD-10-CM | POA: Insufficient documentation

## 2012-06-04 DIAGNOSIS — M109 Gout, unspecified: Secondary | ICD-10-CM | POA: Insufficient documentation

## 2012-06-04 DIAGNOSIS — S39012A Strain of muscle, fascia and tendon of lower back, initial encounter: Secondary | ICD-10-CM

## 2012-06-04 DIAGNOSIS — X500XXA Overexertion from strenuous movement or load, initial encounter: Secondary | ICD-10-CM | POA: Insufficient documentation

## 2012-06-04 DIAGNOSIS — E119 Type 2 diabetes mellitus without complications: Secondary | ICD-10-CM | POA: Insufficient documentation

## 2012-06-04 DIAGNOSIS — I10 Essential (primary) hypertension: Secondary | ICD-10-CM | POA: Insufficient documentation

## 2012-06-04 DIAGNOSIS — S335XXA Sprain of ligaments of lumbar spine, initial encounter: Secondary | ICD-10-CM | POA: Insufficient documentation

## 2012-06-04 NOTE — ED Notes (Signed)
Pain in left flank, started after cleaning trash in her yard

## 2012-06-05 LAB — URINALYSIS, ROUTINE W REFLEX MICROSCOPIC
Ketones, ur: NEGATIVE mg/dL
Leukocytes, UA: NEGATIVE
Nitrite: NEGATIVE
Protein, ur: NEGATIVE mg/dL

## 2012-06-05 MED ORDER — ONDANSETRON 4 MG PO TBDP
4.0000 mg | ORAL_TABLET | Freq: Once | ORAL | Status: AC
  Administered 2012-06-05: 4 mg via ORAL
  Filled 2012-06-05: qty 1

## 2012-06-05 MED ORDER — HYDROCODONE-ACETAMINOPHEN 5-325 MG PO TABS: ORAL_TABLET | ORAL | Status: DC

## 2012-06-05 MED ORDER — METHOCARBAMOL 500 MG PO TABS: ORAL_TABLET | ORAL | Status: DC

## 2012-06-05 MED ORDER — METHOCARBAMOL 500 MG PO TABS
1000.0000 mg | ORAL_TABLET | Freq: Once | ORAL | Status: AC
  Administered 2012-06-05: 1000 mg via ORAL
  Filled 2012-06-05: qty 2

## 2012-06-05 MED ORDER — HYDROCODONE-ACETAMINOPHEN 5-325 MG PO TABS
2.0000 | ORAL_TABLET | Freq: Once | ORAL | Status: AC
  Administered 2012-06-05: 2 via ORAL
  Filled 2012-06-05: qty 2

## 2012-06-05 NOTE — ED Provider Notes (Signed)
Medical screening examination/treatment/procedure(s) were performed by non-physician practitioner and as supervising physician I was immediately available for consultation/collaboration.    Vida Roller, MD 06/05/12 [phone]

## 2012-06-05 NOTE — ED Provider Notes (Signed)
History     CSN: [id number]  Arrival date & time 06/04/12  2155   First MD Initiated Contact with Patient 06/04/12 2350      Chief Complaint  Patient presents with  . Flank Pain    (Consider location/radiation/quality/duration/timing/severity/associated sxs/prior treatment) HPI Comments: Patient is a 66 year old female who states that from time to time she has some pain in her lower back left more so than right. Yesterday she was clearing trash in her yard and she had more pain in her left flank. This pain seemed to start in her lower back and moved to the flank area. The patient denies any dysuria. Denies hematuria. Denies any recent injury or trauma. There's been no fever. The patient denies any recent operations or procedures involving the left flank area. There is no history of kidney stones or ovarian cyst. Patient request evaluation for this problem.  Patient is a 66 y.o. female presenting with flank pain. The history is provided by the patient.  Flank Pain Associated symptoms include arthralgias. Pertinent negatives include no abdominal pain, chest pain, coughing or neck pain.    Past Medical History  Diagnosis Date  . Vertigo     history  . Gout     right elbow  . Diabetes mellitus   . Hypertension   . Anemia     Past Surgical History  Procedure Date  . Abdominal hysterectomy   . Replacement total knee     right    No family history on file.  History  Substance Use Topics  . Smoking status: Never Smoker   . Smokeless tobacco: Not on file  . Alcohol Use: No    OB History    Grav Para Term Preterm Abortions TAB SAB Ect Mult Living                  Review of Systems  Constitutional: Negative for activity change.       All ROS Neg except as noted in HPI  HENT: Negative for nosebleeds and neck pain.   Eyes: Negative for photophobia and discharge.  Respiratory: Negative for cough, shortness of breath and wheezing.   Cardiovascular: Negative for chest  pain and palpitations.  Gastrointestinal: Negative for abdominal pain and blood in stool.  Genitourinary: Positive for flank pain. Negative for dysuria, frequency and hematuria.  Musculoskeletal: Positive for arthralgias. Negative for back pain.  Skin: Negative.   Neurological: Positive for dizziness. Negative for seizures and speech difficulty.  Psychiatric/Behavioral: Negative for hallucinations and confusion.    Allergies  Penicillins  Home Medications   Current Outpatient Rx  Name Route Sig Dispense Refill  . CARVEDILOL 25 MG PO TABS Oral Take 25 mg by mouth 2 (two) times daily with a meal.      . GEMFIBROZIL 600 MG PO TABS Oral Take 600 mg by mouth 2 (two) times daily before a meal.      . GLIPIZIDE ER PO Oral Take 10 mg by mouth 2 (two) times daily.      Marland Kitchen HYDROCODONE-ACETAMINOPHEN 5-325 MG PO TABS  1 or 2 po q4h prn pain 20 tablet 0  . LISINOPRIL-HYDROCHLOROTHIAZIDE 20-12.5 MG PO TABS Oral Take 1 tablet by mouth daily.      Marland Kitchen METFORMIN HCL ER (OSM) 1000 MG PO TB24 Oral Take 1,000 mg by mouth daily with breakfast.      . METHOCARBAMOL 500 MG PO TABS  1 po tid for spasm/pain 21 tablet 0  . NAPROXEN 500 MG PO  TABS Oral Take 500 mg by mouth 2 (two) times daily with a meal.        BP 140/90  Pulse 72  Temp 97.8 F (36.6 C) (Oral)  Resp 20  Ht 5\' 7"  (1.702 m)  Wt 220 lb (99.791 kg)  BMI 34.46 kg/m2  SpO2 99%  Physical Exam  Nursing note and vitals reviewed. Constitutional: She is oriented to person, place, and time. She appears well-developed and well-nourished.  Non-toxic appearance.  HENT:  Head: Normocephalic.  Right Ear: Tympanic membrane and external ear normal.  Left Ear: Tympanic membrane and external ear normal.  Eyes: EOM and lids are normal. Pupils are equal, round, and reactive to light.  Neck: Normal range of motion. Neck supple. Carotid bruit is not present.  Cardiovascular: Normal rate, regular rhythm, normal heart sounds, intact distal pulses and normal  pulses.   Pulmonary/Chest: Breath sounds normal. No respiratory distress.  Abdominal: Soft. Bowel sounds are normal. There is no tenderness. There is no guarding.  Musculoskeletal: Normal range of motion.       No significant pain to palpation of the flank area or the lower back area. There is pain however 2 range of motion exercises involving the left flank and lower back area.  Lymphadenopathy:       Head (right side): No submandibular adenopathy present.       Head (left side): No submandibular adenopathy present.    She has no cervical adenopathy.  Neurological: She is alert and oriented to person, place, and time. She has normal strength. No cranial nerve deficit or sensory deficit. She exhibits normal muscle tone. Coordination normal.       Gait within normal limits.  Skin: Skin is warm and dry.  Psychiatric: She has a normal mood and affect. Her speech is normal.    ED Course  Procedures (including critical care time)   Labs Reviewed  URINALYSIS, ROUTINE W REFLEX MICROSCOPIC   No results found.   1. Lumbar strain       MDM  I have reviewed nursing notes, vital signs, and all appropriate lab and imaging results for this patient. Urine analysis is negative. Vital signs are well within normal limits. Pain reproduced with range of motion exercises. The patient is treated with Robaxin 3 times daily and Norco every 4 hours as needed for pain #20. Patient is to see her primary physician for recheck and evaluation.       Kathie Dike, [place] 06/05/12 [phone]

## 2012-06-05 NOTE — ED Notes (Signed)
Discharge instructions given and reviewed with patient.  Prescriptions given for Robaxin and Vicodin; effects and use explained.  Patient verbalized understanding of sedating effects of medications and to follow up with her PMD as needed.  Patient ambulatory; discharged home in good condition.

## 2012-12-13 ENCOUNTER — Other Ambulatory Visit (HOSPITAL_COMMUNITY): Payer: Self-pay | Admitting: Family Medicine

## 2012-12-13 DIAGNOSIS — Z139 Encounter for screening, unspecified: Secondary | ICD-10-CM

## 2012-12-19 ENCOUNTER — Ambulatory Visit (HOSPITAL_COMMUNITY)
Admission: RE | Admit: 2012-12-19 | Discharge: 2012-12-19 | Disposition: A | Discharge status: Home / Self Care | Payer: Medicare Other | Source: Ambulatory Visit | Attending: Family Medicine | Admitting: Family Medicine

## 2012-12-19 DIAGNOSIS — Z139 Encounter for screening, unspecified: Secondary | ICD-10-CM

## 2012-12-19 DIAGNOSIS — Z1231 Encounter for screening mammogram for malignant neoplasm of breast: Secondary | ICD-10-CM | POA: Insufficient documentation

## 2013-01-02 ENCOUNTER — Ambulatory Visit (INDEPENDENT_AMBULATORY_CARE_PROVIDER_SITE_OTHER): Payer: Medicare Other | Admitting: Orthopedic Surgery

## 2013-01-02 ENCOUNTER — Ambulatory Visit (INDEPENDENT_AMBULATORY_CARE_PROVIDER_SITE_OTHER): Payer: Medicare Other

## 2013-01-02 VITALS — BP 108/58 | Ht 67.0 in | Wt 220.0 lb

## 2013-01-02 NOTE — Progress Notes (Signed)
Patient ID: [name] [name], female   DOB: 1946/10/12, 67 y.o.   MRN: [id number]  Chief Complaint  Patient presents with  . Follow-up    annual xray and follow up right TKA DOS 09/15/2008     History the patient is status post knee replacement on the right she's here for her annual followup x-ray. She had surgery in 2009  Review of systems no problems  Exam expanded BP 108/58  Ht 5\' 7"  (1.702 m)  Wt 220 lb (99.791 kg)  BMI 34.45 kg/m2 General appearance is normal Inspection wound looks clean dry and intact no swelling, flexion is 120 full extension. Stability normal in the coronal and sagittal plane. Muscle strength and tone normal  X-ray ordered and interpreted as stable prosthesis  Impression status post total knee doing well stable return 1 year x-ray

## 2013-08-09 ENCOUNTER — Emergency Department (HOSPITAL_COMMUNITY)
Admission: EM | Admit: 2013-08-09 | Discharge: 2013-08-09 | Disposition: A | Discharge status: Home / Self Care | Payer: Medicare Other | Attending: Emergency Medicine | Admitting: Emergency Medicine

## 2013-08-09 ENCOUNTER — Encounter (HOSPITAL_COMMUNITY): Payer: Self-pay | Admitting: Emergency Medicine

## 2013-08-09 ENCOUNTER — Emergency Department (HOSPITAL_COMMUNITY): Payer: Medicare Other

## 2013-08-09 DIAGNOSIS — M109 Gout, unspecified: Secondary | ICD-10-CM | POA: Insufficient documentation

## 2013-08-09 DIAGNOSIS — M5412 Radiculopathy, cervical region: Secondary | ICD-10-CM | POA: Insufficient documentation

## 2013-08-09 DIAGNOSIS — Y9389 Activity, other specified: Secondary | ICD-10-CM | POA: Insufficient documentation

## 2013-08-09 DIAGNOSIS — X503XXA Overexertion from repetitive movements, initial encounter: Secondary | ICD-10-CM | POA: Insufficient documentation

## 2013-08-09 DIAGNOSIS — Z862 Personal history of diseases of the blood and blood-forming organs and certain disorders involving the immune mechanism: Secondary | ICD-10-CM | POA: Insufficient documentation

## 2013-08-09 DIAGNOSIS — Y929 Unspecified place or not applicable: Secondary | ICD-10-CM | POA: Insufficient documentation

## 2013-08-09 DIAGNOSIS — I10 Essential (primary) hypertension: Secondary | ICD-10-CM | POA: Insufficient documentation

## 2013-08-09 DIAGNOSIS — S46911A Strain of unspecified muscle, fascia and tendon at shoulder and upper arm level, right arm, initial encounter: Secondary | ICD-10-CM

## 2013-08-09 DIAGNOSIS — IMO0002 Reserved for concepts with insufficient information to code with codable children: Secondary | ICD-10-CM | POA: Insufficient documentation

## 2013-08-09 DIAGNOSIS — Z880 Allergy status to penicillin: Secondary | ICD-10-CM | POA: Insufficient documentation

## 2013-08-09 DIAGNOSIS — E119 Type 2 diabetes mellitus without complications: Secondary | ICD-10-CM | POA: Insufficient documentation

## 2013-08-09 DIAGNOSIS — Z79899 Other long term (current) drug therapy: Secondary | ICD-10-CM | POA: Insufficient documentation

## 2013-08-09 DIAGNOSIS — M5410 Radiculopathy, site unspecified: Secondary | ICD-10-CM

## 2013-08-09 MED ORDER — HYDROCODONE-ACETAMINOPHEN 5-325 MG PO TABS: 1.0000 | ORAL_TABLET | Freq: Four times a day (QID) | ORAL | Status: DC | PRN

## 2013-08-09 MED ORDER — HYDROCODONE-ACETAMINOPHEN 5-325 MG PO TABS
1.0000 | ORAL_TABLET | Freq: Once | ORAL | Status: AC
  Administered 2013-08-09: 1 via ORAL
  Filled 2013-08-09: qty 1

## 2013-08-09 NOTE — ED Provider Notes (Signed)
CSN: [id number]     Arrival date & time 08/09/13  1707 History   First MD Initiated Contact with Patient 08/09/13 1712     Chief Complaint  Patient presents with  . Arm Pain   (Consider location/radiation/quality/duration/timing/severity/associated sxs/prior Treatment) HPI This is a 67 year old female with history of diabetes, hypertension who presents with bilateral arm complaints. The patient reports that over the last month she has been very busy doing a lot of manual cleaning. She reports a one-month history of left arm pain. It starts in her elbow and radiates down to her fingers. She states that it is sharp pain and she is also noted tingling. She denies any weakness. She denies any injury. Patient also states she has had right-sided shoulder pain that is nonradiating and aching in nature. She's tried Tylenol home without any relief. She states that she's having difficulty sleeping at night because of the pain.  Past Medical History  Diagnosis Date  . Vertigo     history  . Gout     right elbow  . Diabetes mellitus   . Hypertension   . Anemia    Past Surgical History  Procedure Laterality Date  . Abdominal hysterectomy    . Replacement total knee      right   History reviewed. No pertinent family history. History  Substance Use Topics  . Smoking status: Never Smoker   . Smokeless tobacco: Not on file  . Alcohol Use: No   OB History   Grav Para Term Preterm Abortions TAB SAB Ect Mult Living                 Review of Systems  Constitutional: Negative for fever.  Respiratory: Negative for cough, chest tightness and shortness of breath.   Cardiovascular: Negative for chest pain.  Gastrointestinal: Negative for nausea, vomiting and abdominal pain.  Genitourinary: Negative for dysuria.  Neurological: Negative for dizziness, syncope, weakness and headaches.  Psychiatric/Behavioral: Negative for confusion.  All other systems reviewed and are negative.    Allergies   Penicillins  Home Medications   Current Outpatient Rx  Name  Route  Sig  Dispense  Refill  . allopurinol (ZYLOPRIM) 300 MG tablet   Oral   Take 300 mg by mouth daily.         . carvedilol (COREG) 25 MG tablet   Oral   Take 25 mg by mouth 2 (two) times daily with a meal.           . gemfibrozil (LOPID) 600 MG tablet   Oral   Take 600 mg by mouth 2 (two) times daily before a meal.           . glipiZIDE (GLUCOTROL XL) 10 MG 24 hr tablet   Oral   Take 10 mg by mouth daily.         Marland Kitchen lisinopril-hydrochlorothiazide (PRINZIDE,ZESTORETIC) 20-12.5 MG per tablet   Oral   Take 1 tablet by mouth daily.           . metformin (FORTAMET) 1000 MG (OSM) 24 hr tablet   Oral   Take 1,000 mg by mouth daily.         . simvastatin (ZOCOR) 40 MG tablet   Oral   Take 40 mg by mouth at bedtime.         Marland Kitchen HYDROcodone-acetaminophen (NORCO/VICODIN) 5-325 MG per tablet   Oral   Take 1 tablet by mouth every 6 (six) hours as needed for pain.  6 tablet   0    BP 139/63  Pulse 77  Temp(Src) 97.8 F (36.6 C) (Oral)  Resp 16  SpO2 95% Physical Exam  Nursing note and vitals reviewed. Constitutional: She is oriented to person, place, and time. She appears well-developed and well-nourished. No distress.  HENT:  Head: Normocephalic and atraumatic.  Neck: Neck supple.  Cardiovascular: Normal rate, regular rhythm and normal heart sounds.   Pulmonary/Chest: Effort normal and breath sounds normal. No respiratory distress. She has no wheezes.  Abdominal: Soft. Bowel sounds are normal. She exhibits no distension. There is no tenderness. There is no rebound.  Musculoskeletal:  Focused examination of the right shoulder shows normal range of motion. There is tenderness to palpation over the posterior aspect of the shoulder over the deltoid. There is no bony tenderness or deformity noted. Patient has normal strength. Focused examination of the left upper extremity reveals no obvious  deformity. Patient has intact sensation. Tenderness to palpation over the medial elbow reproduces pain down the patient's left arm.  Neurological: She is alert and oriented to person, place, and time. She has normal reflexes. No cranial nerve deficit.  Skin: Skin is warm and dry.  Psychiatric: She has a normal mood and affect.    ED Course  Procedures (including critical care time) Labs Review Labs Reviewed - No data to display Imaging Review Dg Shoulder Right  08/09/2013   CLINICAL DATA:  Initial encounter for shoulder pain which the patient developed after exercising earlier today.  EXAM: RIGHT SHOULDER - 2+ VIEW  COMPARISON:  12/10/2008.  FINDINGS: No evidence of acute fracture or glenohumeral dislocation. Subacromial space well preserved. Mild degenerative changes in the acromioclavicular joint. No intrinsic osseous abnormalities. Well preserved bone mineral density. No significant change since the prior examination.  IMPRESSION: No acute osseous abnormality. Mild degenerative changes in the [facility] joint.   Electronically Signed   By: Hulan Saas   On: 08/09/2013 17:40    MDM   1. Radiculopathy of arm   2. Shoulder strain, right, initial encounter    This is a 67 year old female who presents with bilateral arm pain. She reports radicular symptoms on the left an achy pain on the right shoulder. She is nontoxic-appearing. She is neurologically intact. Palpation of the ulnar aspect of her elbow reproduces radicular symptoms on the left suggestive of nerve impingement.  X-rays of a right shoulder are negative. Patient will be given a short course of pain medication for sleep at night. She was encouraged followup with her primary care for provider for referral to physical therapy.  The patient stated understanding. She was given strict return precautions.  After history, exam, and medical workup I feel the patient has been appropriately medically screened and is safe for discharge home.  Pertinent diagnoses were discussed with the patient. Patient was given return precautions.    Shon Baton, MD 08/09/13 1816

## 2013-08-09 NOTE — ED Notes (Signed)
Pt c/o pain to left lower arm with intermittent numbness and right shoulder pain x 1 month. Has not seen pcp for it. Nad. Denies dizziness/cp/sob/n/v/d/radiation of pain. ROM wnl. Denies injury

## 2014-01-08 ENCOUNTER — Ambulatory Visit: Payer: Medicare Other | Admitting: Orthopedic Surgery

## 2014-01-16 ENCOUNTER — Other Ambulatory Visit (HOSPITAL_COMMUNITY): Payer: Self-pay | Admitting: Family Medicine

## 2014-01-16 DIAGNOSIS — Z139 Encounter for screening, unspecified: Secondary | ICD-10-CM

## 2014-01-20 ENCOUNTER — Ambulatory Visit (HOSPITAL_COMMUNITY)
Admission: RE | Admit: 2014-01-20 | Discharge: 2014-01-20 | Disposition: A | Discharge status: Home / Self Care | Payer: Medicare Other | Source: Ambulatory Visit | Attending: Family Medicine | Admitting: Family Medicine

## 2014-01-20 DIAGNOSIS — Z1231 Encounter for screening mammogram for malignant neoplasm of breast: Secondary | ICD-10-CM | POA: Insufficient documentation

## 2014-01-20 DIAGNOSIS — Z139 Encounter for screening, unspecified: Secondary | ICD-10-CM

## 2014-02-22 ENCOUNTER — Encounter (HOSPITAL_COMMUNITY): Payer: Self-pay | Admitting: Emergency Medicine

## 2014-02-22 ENCOUNTER — Emergency Department (HOSPITAL_COMMUNITY)
Admission: EM | Admit: 2014-02-22 | Discharge: 2014-02-22 | Disposition: A | Discharge status: Home / Self Care | Payer: Medicare Other | Attending: Emergency Medicine | Admitting: Emergency Medicine

## 2014-02-22 DIAGNOSIS — Z880 Allergy status to penicillin: Secondary | ICD-10-CM | POA: Insufficient documentation

## 2014-02-22 DIAGNOSIS — Z79899 Other long term (current) drug therapy: Secondary | ICD-10-CM | POA: Insufficient documentation

## 2014-02-22 DIAGNOSIS — Z862 Personal history of diseases of the blood and blood-forming organs and certain disorders involving the immune mechanism: Secondary | ICD-10-CM | POA: Insufficient documentation

## 2014-02-22 DIAGNOSIS — M109 Gout, unspecified: Secondary | ICD-10-CM | POA: Insufficient documentation

## 2014-02-22 DIAGNOSIS — W57XXXA Bitten or stung by nonvenomous insect and other nonvenomous arthropods, initial encounter: Principal | ICD-10-CM | POA: Insufficient documentation

## 2014-02-22 DIAGNOSIS — S30861A Insect bite (nonvenomous) of abdominal wall, initial encounter: Secondary | ICD-10-CM

## 2014-02-22 DIAGNOSIS — E119 Type 2 diabetes mellitus without complications: Secondary | ICD-10-CM | POA: Insufficient documentation

## 2014-02-22 DIAGNOSIS — S30860A Insect bite (nonvenomous) of lower back and pelvis, initial encounter: Secondary | ICD-10-CM | POA: Insufficient documentation

## 2014-02-22 DIAGNOSIS — I10 Essential (primary) hypertension: Secondary | ICD-10-CM | POA: Insufficient documentation

## 2014-02-22 DIAGNOSIS — Y92009 Unspecified place in unspecified non-institutional (private) residence as the place of occurrence of the external cause: Secondary | ICD-10-CM | POA: Insufficient documentation

## 2014-02-22 DIAGNOSIS — Y93H2 Activity, gardening and landscaping: Secondary | ICD-10-CM | POA: Insufficient documentation

## 2014-02-22 MED ORDER — DOXYCYCLINE HYCLATE 100 MG PO CAPS: 100.0000 mg | ORAL_CAPSULE | Freq: Two times a day (BID) | ORAL | Status: DC

## 2014-02-22 NOTE — ED Provider Notes (Signed)
CSN: [id number]     Arrival date & time 02/22/14  6440 History   First MD Initiated Contact with Patient 02/22/14 [phone]     Chief Complaint  Patient presents with  . Tick Removal     (Consider location/radiation/quality/duration/timing/severity/associated sxs/prior Treatment) The history is provided by the patient.   68 year old female states that she pulled tooth checked off of her abdomen this morning. She thinks they were on since she was working in her yard yesterday morning. There is itching at the site where she pulled them out but she denies fever or chills. She denies any pain.  Past Medical History  Diagnosis Date  . Vertigo     history  . Gout     right elbow  . Diabetes mellitus   . Hypertension   . Anemia    Past Surgical History  Procedure Laterality Date  . Abdominal hysterectomy    . Replacement total knee      right   No family history on file. History  Substance Use Topics  . Smoking status: Never Smoker   . Smokeless tobacco: Not on file  . Alcohol Use: No   OB History   Grav Para Term Preterm Abortions TAB SAB Ect Mult Living                 Review of Systems  All other systems reviewed and are negative.     Allergies  Penicillins  Home Medications   Current Outpatient Rx  Name  Route  Sig  Dispense  Refill  . allopurinol (ZYLOPRIM) 300 MG tablet   Oral   Take 300 mg by mouth daily.         . carvedilol (COREG) 25 MG tablet   Oral   Take 25 mg by mouth 2 (two) times daily with a meal.           . doxycycline (VIBRAMYCIN) 100 MG capsule   Oral   Take 1 capsule (100 mg total) by mouth 2 (two) times daily.   20 capsule   0   . gemfibrozil (LOPID) 600 MG tablet   Oral   Take 600 mg by mouth 2 (two) times daily before a meal.           . glipiZIDE (GLUCOTROL XL) 10 MG 24 hr tablet   Oral   Take 10 mg by mouth daily.         Marland Kitchen HYDROcodone-acetaminophen (NORCO/VICODIN) 5-325 MG per tablet   Oral   Take 1 tablet by mouth  every 6 (six) hours as needed for pain.   6 tablet   0   . lisinopril-hydrochlorothiazide (PRINZIDE,ZESTORETIC) 20-12.5 MG per tablet   Oral   Take 1 tablet by mouth daily.           . metformin (FORTAMET) 1000 MG (OSM) 24 hr tablet   Oral   Take 1,000 mg by mouth daily.         . simvastatin (ZOCOR) 40 MG tablet   Oral   Take 40 mg by mouth at bedtime.          BP 139/69  Pulse 65  Temp(Src) 97.8 F (36.6 C) (Oral)  Resp 18  Ht 5\' 7"  (1.702 m)  Wt 222 lb (100.699 kg)  BMI 34.76 kg/m2  SpO2 98% Physical Exam  Nursing note and vitals reviewed.  68 year old female, resting comfortably and in no acute distress. Vital signs are normal. Oxygen saturation is 98%, which is  normal. Head is normocephalic and atraumatic. PERRLA, EOMI. Oropharynx is clear. Neck is nontender and supple without adenopathy or JVD. Back is nontender and there is no CVA tenderness. Lungs are clear without rales, wheezes, or rhonchi. Chest is nontender. Heart has regular rate and rhythm without murmur. Abdomen is soft, flat, nontender without masses or hepatosplenomegaly and peristalsis is normoactive. There are 2 erythematous, slightly raised areas of the right side of the abdomen having appearance of arthropod bites. No evidence of retained tick mouth parts. There is one similar lesion in the left lateral abdomen. Extremities have no cyanosis or edema, full range of motion is present. Skin is warm and dry without rash. Neurologic: Mental status is normal, cranial nerves are intact, there are no motor or sensory deficits.  ED Course  Procedures (including critical care time) Labs Review Labs Reviewed - No data to display Imaging Review No results found.   EKG Interpretation None      MDM   Final diagnoses:  Tick bite of abdomen    Tick bites of the abdomen. Take may have been embedded for her beyond 20 hours, so she will be given a course of doxycycline to treat for possible [facility] spotted fever exposure.    Delora Fuel, MD [phone]/[id number]/[phone]

## 2014-02-22 NOTE — Discharge Instructions (Signed)
Tick Bite Information Ticks are insects that attach themselves to the skin and draw blood for food. There are various types of ticks. Common types include wood ticks and deer ticks. Most ticks live in shrubs and grassy areas. Ticks can climb onto your body when you make contact with leaves or grass where the tick is waiting. The most common places on the body for ticks to attach themselves are the scalp, neck, armpits, waist, and groin. Most tick bites are harmless, but sometimes ticks carry germs that cause diseases. These germs can be spread to a person during the tick's feeding process. The chance of a disease spreading through a tick bite depends on:   The type of tick.  Time of year.   How [place] the tick is attached.   Geographic location.  HOW CAN YOU PREVENT TICK BITES? Take these steps to help prevent tick bites when you are outdoors:  Wear protective clothing. Settle sleeves and [place] pants are best.   Wear white clothes so you can see ticks more easily.  Tuck your pant legs into your socks.   If walking on a trail, stay in the middle of the trail to avoid brushing against bushes.  Avoid walking through areas with [place] grass.  Put insect repellent on all exposed skin and along boot tops, pant legs, and sleeve cuffs.   Check clothing, hair, and skin repeatedly and before going inside.   Brush off any ticks that are not attached.  Take a shower or bath as soon as possible after being outdoors.  WHAT IS THE PROPER WAY TO REMOVE A TICK? Ticks should be removed as soon as possible to help prevent diseases caused by tick bites. 1. If latex gloves are available, put them on before trying to remove a tick.  2. Using fine-point tweezers, grasp the tick as close to the skin as possible. You may also use curved forceps or a tick removal tool. Grasp the tick as close to its head as possible. Avoid grasping the tick on its body. 3. Pull gently with steady upward pressure until  the tick lets go. Do not twist the tick or jerk it suddenly. This may break off the tick's head or mouth parts. 4. Do not squeeze or crush the tick's body. This could force disease-carrying fluids from the tick into your body.  5. After the tick is removed, wash the bite area and your hands with soap and water or other disinfectant such as alcohol. 6. Apply a small amount of antiseptic cream or ointment to the bite site.  7. Wash and disinfect any instruments that were used.  Do not try to remove a tick by applying a hot match, petroleum jelly, or fingernail polish to the tick. These methods do not work and may increase the chances of disease being spread from the tick bite.  WHEN SHOULD YOU SEEK MEDICAL CARE? Contact your health care provider if you are unable to remove a tick from your skin or if a part of the tick breaks off and is stuck in the skin.  After a tick bite, you need to be aware of signs and symptoms that could be related to diseases spread by ticks. Contact your health care provider if you develop any of the following in the days or weeks after the tick bite:  Unexplained fever.  Rash. A circular rash that appears days or weeks after the tick bite may indicate the possibility of Lyme disease. The rash may resemble  a target with a bull's-eye and may occur at a different part of your body than the tick bite.  Redness and swelling in the area of the tick bite.   Tender, swollen lymph glands.   Diarrhea.   Weight loss.   Cough.   Fatigue.   Muscle, joint, or bone pain.   Abdominal pain.   Headache.   Lethargy or a change in your level of consciousness.  Difficulty walking or moving your legs.   Numbness in the legs.   Paralysis.  Shortness of breath.   Confusion.   Repeated vomiting.  Document Released: 10/27/2000 Document Revised: 08/20/2013 Document Reviewed: 04/09/2013 [facility] Patient Information 2014 [place].  Doxycycline  tablets or capsules What is this medicine? DOXYCYCLINE (dox i SYE kleen) is a tetracycline antibiotic. It kills certain bacteria or stops their growth. It is used to treat many kinds of infections, like dental, skin, respiratory, and urinary tract infections. It also treats acne, Lyme disease, malaria, and certain sexually transmitted infections. This medicine may be used for other purposes; ask your health care provider or pharmacist if you have questions. COMMON BRAND NAME(S): Adoxa CK, Adoxa Pak, Adoxa TT, Adoxa, Alodox, Avidoxy, Doxal, Monodox, Morgidox 1x Kit, Morgidox 1x, Morgidox 2x , Morgidox 2x Kit, Ocudox , Vibra-Tabs, Vibramycin What should I tell my health care provider before I take this medicine? They need to know if you have any of these conditions: -liver disease -[place] exposure to sunlight like working outdoors -stomach problems like colitis -an unusual or allergic reaction to doxycycline, tetracycline antibiotics, other medicines, foods, dyes, or preservatives -pregnant or trying to get pregnant -breast-feeding How should I use this medicine? Take this medicine by mouth with a full glass of water. Follow the directions on the prescription label. It is best to take this medicine without food, but if it upsets your stomach take it with food. Take your medicine at regular intervals. Do not take your medicine more often than directed. Take all of your medicine as directed even if you think you are better. Do not skip doses or stop your medicine early. Talk to your pediatrician regarding the use of this medicine in children. Special care may be needed. While this drug may be prescribed for children as young as 80 years old for selected conditions, precautions do apply. Overdosage: If you think you have taken too much of this medicine contact a poison control center or emergency room at once. NOTE: This medicine is only for you. Do not share this medicine with others. What if I miss a  dose? If you miss a dose, take it as soon as you can. If it is almost time for your next dose, take only that dose. Do not take double or extra doses. What may interact with this medicine? -antacids -barbiturates -birth control pills -bismuth subsalicylate -carbamazepine -methoxyflurane -other antibiotics -phenytoin -vitamins that contain iron -warfarin This list may not describe all possible interactions. Give your health care provider a list of all the medicines, herbs, non-prescription drugs, or dietary supplements you use. Also tell them if you smoke, drink alcohol, or use illegal drugs. Some items may interact with your medicine. What should I watch for while using this medicine? Tell your doctor or health care professional if your symptoms do not improve. Do not treat diarrhea with over the counter products. Contact your doctor if you have diarrhea that lasts more than 2 days or if it is severe and watery. Do not take this medicine just before  going to bed. It may not dissolve properly when you lay down and can cause pain in your throat. Drink plenty of fluids while taking this medicine to also help reduce irritation in your throat. This medicine can make you more sensitive to the sun. Keep out of the sun. If you cannot avoid being in the sun, wear protective clothing and use sunscreen. Do not use sun lamps or tanning beds/booths. Birth control pills may not work properly while you are taking this medicine. Talk to your doctor about using an extra method of birth control. If you are being treated for a sexually transmitted infection, avoid sexual contact until you have finished your treatment. Your sexual partner may also need treatment. Avoid antacids, aluminum, calcium, magnesium, and iron products for 4 hours before and 2 hours after taking a dose of this medicine. If you are using this medicine to prevent malaria, you should still protect yourself from contact with mosquitos. Stay in  screened-in areas, use mosquito nets, keep your body covered, and use an insect repellent. What side effects may I notice from receiving this medicine? Side effects that you should report to your doctor or health care professional as soon as possible: -allergic reactions like skin rash, itching or hives, swelling of the face, lips, or tongue -difficulty breathing -fever -itching in the rectal or genital area -pain on swallowing -redness, blistering, peeling or loosening of the skin, including inside the mouth -severe stomach pain or cramps -unusual bleeding or bruising -unusually weak or tired -yellowing of the eyes or skin Side effects that usually do not require medical attention (report to your doctor or health care professional if they continue or are bothersome): -diarrhea -loss of appetite -nausea, vomiting This list may not describe all possible side effects. Call your doctor for medical advice about side effects. You may report side effects to FDA at 1-800-FDA-1088. Where should I keep my medicine? Keep out of the reach of children. Store at room temperature, below 30 degrees C (86 degrees F). Protect from light. Keep container tightly closed. Throw away any unused medicine after the expiration date. Taking this medicine after the expiration date can make you seriously ill. NOTE: This sheet is a summary. It may not cover all possible information. If you have questions about this medicine, talk to your doctor, pharmacist, or health care provider.  2014, [employer]. (2008-02-18 16:53:02)

## 2014-02-22 NOTE — ED Notes (Signed)
Woke with itching on abd, found 2 ticks on her abd, pulled them off but wants the sites checked

## 2014-02-24 ENCOUNTER — Ambulatory Visit: Payer: Medicare Other | Admitting: Orthopedic Surgery

## 2014-03-10 ENCOUNTER — Ambulatory Visit (INDEPENDENT_AMBULATORY_CARE_PROVIDER_SITE_OTHER): Payer: Medicare Other

## 2014-03-10 ENCOUNTER — Ambulatory Visit (INDEPENDENT_AMBULATORY_CARE_PROVIDER_SITE_OTHER): Payer: Medicare Other | Admitting: Orthopedic Surgery

## 2014-03-10 ENCOUNTER — Encounter: Payer: Self-pay | Admitting: Orthopedic Surgery

## 2014-03-10 VITALS — BP 124/74 | Ht 67.0 in | Wt 222.0 lb

## 2014-03-10 DIAGNOSIS — Z96659 Presence of unspecified artificial knee joint: Secondary | ICD-10-CM

## 2014-03-10 NOTE — Progress Notes (Signed)
Patient ID: [name] [name], female   DOB: Feb 21, 1946, 68 y.o.   MRN: [id number] Est patient  Chief Complaint  Patient presents with  . Follow-up    annual xray and follow up right TKA DOS 09/15/2008    Encounter Diagnosis  Name Primary?  . S/P knee replacement Yes   Chief complaint total knee follow-up.  History this is a follow-up visit. Status post right total knee replacement.  DOS: November 2009 Implant DePuy  Review of systems patient has no complaints.  Exam Physical Exam(6) GENERAL: normal development   CDV: pulses are normal   Skin: normal  Psychiatric: awake, alert and oriented  Neuro: normal sensation  1 ambulation normal without assistive device 2 ROM = 115 3 Motor normal  4 Stability normal   Separate x-ray report.  Reason for x-ray, and we'll x-ray follow-up knee replacement.  3 views right knee.  The implant is aligned normally. There is no loosening.  Impression normal appearing knee replacement.    Assessment: Knee replacement functioning well    Plan: One year follow

## 2014-11-13 ENCOUNTER — Emergency Department (HOSPITAL_COMMUNITY)
Admission: EM | Admit: 2014-11-13 | Discharge: 2014-11-13 | Disposition: A | Discharge status: Home / Self Care | Payer: Medicare Other | Attending: Emergency Medicine | Admitting: Emergency Medicine

## 2014-11-13 ENCOUNTER — Emergency Department (HOSPITAL_COMMUNITY): Payer: Medicare Other

## 2014-11-13 ENCOUNTER — Encounter (HOSPITAL_COMMUNITY): Payer: Self-pay

## 2014-11-13 DIAGNOSIS — J0100 Acute maxillary sinusitis, unspecified: Secondary | ICD-10-CM | POA: Diagnosis not present

## 2014-11-13 DIAGNOSIS — R42 Dizziness and giddiness: Secondary | ICD-10-CM | POA: Insufficient documentation

## 2014-11-13 DIAGNOSIS — J320 Chronic maxillary sinusitis: Secondary | ICD-10-CM

## 2014-11-13 DIAGNOSIS — Z880 Allergy status to penicillin: Secondary | ICD-10-CM | POA: Insufficient documentation

## 2014-11-13 DIAGNOSIS — M109 Gout, unspecified: Secondary | ICD-10-CM | POA: Insufficient documentation

## 2014-11-13 DIAGNOSIS — E119 Type 2 diabetes mellitus without complications: Secondary | ICD-10-CM | POA: Insufficient documentation

## 2014-11-13 DIAGNOSIS — Z792 Long term (current) use of antibiotics: Secondary | ICD-10-CM | POA: Diagnosis not present

## 2014-11-13 DIAGNOSIS — D649 Anemia, unspecified: Secondary | ICD-10-CM | POA: Diagnosis not present

## 2014-11-13 DIAGNOSIS — I10 Essential (primary) hypertension: Secondary | ICD-10-CM | POA: Insufficient documentation

## 2014-11-13 DIAGNOSIS — Z79899 Other long term (current) drug therapy: Secondary | ICD-10-CM | POA: Insufficient documentation

## 2014-11-13 LAB — CBC WITH DIFFERENTIAL/PLATELET
Basophils Absolute: 0 10*3/uL (ref 0.0–0.1)
Basophils Relative: 0 % (ref 0–1)
Eosinophils Absolute: 0.1 10*3/uL (ref 0.0–0.7)
Eosinophils Relative: 1 % (ref 0–5)
HCT: 30.2 % — ABNORMAL LOW (ref 36.0–46.0)
HEMOGLOBIN: 10.2 g/dL — AB (ref 12.0–15.0)
LYMPHS ABS: 1.3 10*3/uL (ref 0.7–4.0)
Lymphocytes Relative: 25 % (ref 12–46)
MCH: 32.2 pg (ref 26.0–34.0)
MCHC: 33.8 g/dL (ref 30.0–36.0)
MCV: 95.3 fL (ref 78.0–100.0)
MONOS PCT: 5 % (ref 3–12)
Monocytes Absolute: 0.3 10*3/uL (ref 0.1–1.0)
NEUTROS PCT: 69 % (ref 43–77)
Neutro Abs: 3.5 10*3/uL (ref 1.7–7.7)
Platelets: 168 10*3/uL (ref 150–400)
RBC: 3.17 MIL/uL — AB (ref 3.87–5.11)
RDW: 13.3 % (ref 11.5–15.5)
WBC: 5.1 10*3/uL (ref 4.0–10.5)

## 2014-11-13 LAB — COMPREHENSIVE METABOLIC PANEL
ALBUMIN: 4.2 g/dL (ref 3.5–5.2)
ALT: 11 U/L (ref 0–35)
ANION GAP: 6 (ref 5–15)
AST: 16 U/L (ref 0–37)
Alkaline Phosphatase: 51 U/L (ref 39–117)
BILIRUBIN TOTAL: 0.5 mg/dL (ref 0.3–1.2)
BUN: 19 mg/dL (ref 6–23)
CHLORIDE: 106 meq/L (ref 96–112)
CO2: 27 mmol/L (ref 19–32)
Calcium: 9.7 mg/dL (ref 8.4–10.5)
Creatinine, Ser: 1.23 mg/dL — ABNORMAL HIGH (ref 0.50–1.10)
GFR calc Af Amer: 52 mL/min — ABNORMAL LOW (ref >90)
GFR calc non Af Amer: 45 mL/min — ABNORMAL LOW (ref >90)
Glucose, Bld: 128 mg/dL — ABNORMAL HIGH (ref 70–99)
Potassium: 3.9 mmol/L (ref 3.5–5.1)
SODIUM: 139 mmol/L (ref 135–145)
Total Protein: 7.3 g/dL (ref 6.0–8.3)

## 2014-11-13 MED ORDER — MECLIZINE HCL 12.5 MG PO TABS
25.0000 mg | ORAL_TABLET | Freq: Once | ORAL | Status: AC
  Administered 2014-11-13: 25 mg via ORAL
  Filled 2014-11-13: qty 2

## 2014-11-13 MED ORDER — MECLIZINE HCL 50 MG PO TABS: 50.0000 mg | ORAL_TABLET | Freq: Three times a day (TID) | ORAL | Status: DC | PRN

## 2014-11-13 MED ORDER — SULFAMETHOXAZOLE-TRIMETHOPRIM 800-160 MG PO TABS: 1.0000 | ORAL_TABLET | Freq: Two times a day (BID) | ORAL | Status: DC

## 2014-11-13 NOTE — Discharge Instructions (Signed)
Dizziness °Dizziness is a common problem. It is a feeling of unsteadiness or light-headedness. You may feel like you are about to faint. Dizziness can lead to injury if you stumble or fall. A person of any age group can suffer from dizziness, but dizziness is more common in older adults. °CAUSES  °Dizziness can be caused by many different things, including: °· Middle ear problems. °· Standing for too Gills. °· Infections. °· An allergic reaction. °· Aging. °· An emotional response to something, such as the sight of blood. °· Side effects of medicines. °· Tiredness. °· Problems with circulation or blood pressure. °· Excessive use of alcohol or medicines, or illegal drug use. °· Breathing too fast (hyperventilation). °· An irregular heart rhythm (arrhythmia). °· A low red blood cell count (anemia). °· Pregnancy. °· Vomiting, diarrhea, fever, or other illnesses that cause body fluid loss (dehydration). °· Diseases or conditions such as Parkinson's disease, high blood pressure (hypertension), diabetes, and thyroid problems. °· Exposure to extreme heat. °DIAGNOSIS  °Your health care provider will ask about your symptoms, perform a physical exam, and perform an electrocardiogram (ECG) to record the electrical activity of your heart. Your health care provider may also perform other heart or blood tests to determine the cause of your dizziness. These may include: °· Transthoracic echocardiogram (TTE). During echocardiography, sound waves are used to evaluate how blood flows through your heart. °· Transesophageal echocardiogram (TEE). °· Cardiac monitoring. This allows your health care provider to monitor your heart rate and rhythm in real time. °· Holter monitor. This is a portable device that records your heartbeat and can help diagnose heart arrhythmias. It allows your health care provider to track your heart activity for several days if needed. °· Stress tests by exercise or by giving medicine that makes the heart beat  faster. °TREATMENT  °Treatment of dizziness depends on the cause of your symptoms and can vary greatly. °HOME CARE INSTRUCTIONS  °· Drink enough fluids to keep your urine clear or pale yellow. This is especially important in very hot weather. In older adults, it is also important in cold weather. °· Take your medicine exactly as directed if your dizziness is caused by medicines. When taking blood pressure medicines, it is especially important to get up slowly. °· Rise slowly from chairs and steady yourself until you feel okay. °· In the morning, first sit up on the side of the bed. When you feel okay, stand slowly while holding onto something until you know your balance is fine. °· Move your legs often if you need to stand in one place for a Jost time. Tighten and relax your muscles in your legs while standing. °· Have someone stay with you for 1-2 days if dizziness continues to be a problem. Do this until you feel you are well enough to stay alone. Have the person call your health care provider if he or she notices changes in you that are concerning. °· Do not drive or use heavy machinery if you feel dizzy. °· Do not drink alcohol. °SEEK IMMEDIATE MEDICAL CARE IF:  °· Your dizziness or light-headedness gets worse. °· You feel nauseous or vomit. °· You have problems talking, walking, or using your arms, hands, or legs. °· You feel weak. °· You are not thinking clearly or you have trouble forming sentences. It may take a friend or family member to notice this. °· You have chest pain, abdominal pain, shortness of breath, or sweating. °· Your vision changes. °· You notice   any bleeding.  You have side effects from medicine that seems to be getting worse rather than better. MAKE SURE YOU:   Understand these instructions.  Will watch your condition.  Will get help right away if you are not doing well or get worse. Document Released: 04/25/2001 Document Revised: 11/04/2013 Document Reviewed: 05/19/2011 [facility]  Patient Information 2015 [place], [place]. This information is not intended to replace advice given to you by your health care provider. Make sure you discuss any questions you have with your health care provider. Sinusitis Sinusitis is redness, soreness, and puffiness (inflammation) of the air pockets in the bones of your face (sinuses). The redness, soreness, and puffiness can cause air and mucus to get trapped in your sinuses. This can allow germs to grow and cause an infection.  HOME CARE   Drink enough fluids to keep your pee (urine) clear or pale yellow.  Use a humidifier in your home.  Run a hot shower to create steam in the bathroom. Sit in the bathroom with the door closed. Breathe in the steam 3-4 times a day.  Put a warm, moist washcloth on your face 3-4 times a day, or as told by your doctor.  Use salt water sprays (saline sprays) to wet the thick fluid in your nose. This can help the sinuses drain.  Only take medicine as told by your doctor. GET HELP RIGHT AWAY IF:   Your pain gets worse.  You have very bad headaches.  You are sick to your stomach (nauseous).  You throw up (vomit).  You are very sleepy (drowsy) all the time.  Your face is puffy (swollen).  Your vision changes.  You have a stiff neck.  You have trouble breathing. MAKE SURE YOU:   Understand these instructions.  Will watch your condition.  Will get help right away if you are not doing well or get worse. Document Released: 04/17/2008 Document Revised: 07/24/2012 Document Reviewed: 06/04/2012 [facility] Patient Information 2015 [place], [place]. This information is not intended to replace advice given to you by your health care provider. Make sure you discuss any questions you have with your health care provider.

## 2014-11-13 NOTE — ED Notes (Signed)
Dizziness and nausea since yesterday. CBG by EMS was 101. Pt a/o x3 upon arrival.

## 2014-11-13 NOTE — ED Provider Notes (Signed)
CSN: [id number]     Arrival date & time 11/13/14  2119 History  This chart was scribed for Shaune Pollack, MD by Chester Holstein, ED Scribe. This patient was seen in room APA03/APA03 and the patient's care was started at 9:40 PM.    Chief Complaint  Patient presents with  . Dizziness    Patient is a 69 y.o. female presenting with dizziness. The history is provided by the patient and a relative. No language interpreter was used.  Dizziness Associated symptoms: nausea   Associated symptoms: no chest pain and no vomiting     HPI Comments: HPI Comments: [name] [name] is a 69 y.o. female brought in by ambulance, who presents to the Emergency Department complaining of  intermittent dizziness with onset yesterday. Pt describes feeling like room spinning. Daughter notes pt was reclined for Esters periods of time today which is different than normal. Pt has had similar symptoms in the past. Pt notes turning her head quickly worsens the dizziness.  Pt notes associated nausea but denies weakness. Pt notes she has to use support to ambulate due to dizziness. Pt is compliant with her medications and is not on any blood thinners. Pt notes she lives with one of her daughters. Pt denies fever, cough, headache, vomiting and chest pain.   Past Medical History  Diagnosis Date  . Vertigo     history  . Gout     right elbow  . Diabetes mellitus   . Hypertension   . Anemia    Past Surgical History  Procedure Laterality Date  . Abdominal hysterectomy    . Replacement total knee      right   No family history on file. History  Substance Use Topics  . Smoking status: Never Smoker   . Smokeless tobacco: Not on file  . Alcohol Use: No   OB History    No data available     Review of Systems  Constitutional: Negative for fever.  Respiratory: Negative for cough.   Cardiovascular: Negative for chest pain.  Gastrointestinal: Positive for nausea. Negative for vomiting.  Neurological: Positive for dizziness.  Negative for weakness.  All other systems reviewed and are negative.     Allergies  Penicillins  Home Medications   Prior to Admission medications   Medication Sig Start Date End Date Taking? Authorizing Provider  allopurinol (ZYLOPRIM) 300 MG tablet Take 300 mg by mouth daily. 07/14/13   Historical Provider, MD  carvedilol (COREG) 25 MG tablet Take 25 mg by mouth 2 (two) times daily with a meal.      Historical Provider, MD  doxycycline (VIBRAMYCIN) 100 MG capsule Take 1 capsule (100 mg total) by mouth 2 (two) times daily. 3/33/54   Delora Fuel, MD  gemfibrozil (LOPID) 600 MG tablet Take 600 mg by mouth 2 (two) times daily before a meal.      Historical Provider, MD  glipiZIDE (GLUCOTROL XL) 10 MG 24 hr tablet Take 10 mg by mouth daily.    Historical Provider, MD  HYDROcodone-acetaminophen (NORCO/VICODIN) 5-325 MG per tablet Take 1 tablet by mouth every 6 (six) hours as needed for pain. 08/09/13   Merryl Hacker, MD  lisinopril-hydrochlorothiazide (PRINZIDE,ZESTORETIC) 20-12.5 MG per tablet Take 1 tablet by mouth daily.      Historical Provider, MD  metFORMIN (GLUCOPHAGE) 1000 MG tablet Take 1,000 mg by mouth daily. 09/23/14   Historical Provider, MD  simvastatin (ZOCOR) 40 MG tablet Take 40 mg by mouth at bedtime. 07/14/13  Historical Provider, MD   BP 135/73 mmHg  Pulse 51  Temp(Src) 97.9 F (36.6 C) (Oral)  Resp 16  SpO2 100% Physical Exam  Constitutional: She is oriented to person, place, and time. She appears well-developed and well-nourished.  HENT:  Head: Normocephalic and atraumatic.  Right Ear: Tympanic membrane and external ear normal.  Left Ear: Tympanic membrane and external ear normal.  Nose: Nose normal. Right sinus exhibits no maxillary sinus tenderness and no frontal sinus tenderness. Left sinus exhibits no maxillary sinus tenderness and no frontal sinus tenderness.  Eyes: Conjunctivae and EOM are normal. Pupils are equal, round, and reactive to light. Right eye  exhibits no nystagmus. Left eye exhibits no nystagmus.  Neck: Normal range of motion. Neck supple.  Cardiovascular: Normal rate, regular rhythm, normal heart sounds and intact distal pulses.   Pulmonary/Chest: Effort normal and breath sounds normal. No respiratory distress. She exhibits no tenderness.  Abdominal: Soft. Bowel sounds are normal. She exhibits no distension and no mass. There is no tenderness.  Musculoskeletal: Normal range of motion. She exhibits no edema or tenderness.  Neurological: She is alert and oriented to person, place, and time. She has normal strength and normal reflexes. No sensory deficit. She displays a negative Romberg sign. GCS eye subscore is 4. GCS verbal subscore is 5. GCS motor subscore is 6.  Reflex Scores:      Tricep reflexes are 2+ on the right side and 2+ on the left side.      Bicep reflexes are 2+ on the right side and 2+ on the left side.      Brachioradialis reflexes are 2+ on the right side and 2+ on the left side.      Patellar reflexes are 2+ on the right side and 2+ on the left side.      Achilles reflexes are 2+ on the right side and 2+ on the left side. Patient with some intermittent difficulty with balance during ambulation Speech is normal without dysarthria, dysphasia, or aphasia. Muscle strength is 5/5 in bilateral shoulders, elbow flexor and extensors, wrist flexor and extensors, and intrinsic hand muscles. 5/5 bilateral lower extremity hip flexors, extensors, knee flexors and extensors, and ankle dorsi and plantar flexors.    Skin: Skin is warm and dry. No rash noted.  Psychiatric: She has a normal mood and affect. Her behavior is normal. Judgment and thought content normal.  Nursing note and vitals reviewed.   ED Course  Procedures (including critical care time) DIAGNOSTIC STUDIES: Oxygen Saturation is 100% on room air, normal by my interpretation.    COORDINATION OF CARE: 9:50 PM Discussed treatment plan with patient at beside, the  patient agrees with the plan and has no further questions at this time.   Labs Review Labs Reviewed  CBC WITH DIFFERENTIAL - Abnormal; Notable for the following:    RBC 3.17 (*)    Hemoglobin 10.2 (*)    HCT 30.2 (*)    All other components within normal limits  COMPREHENSIVE METABOLIC PANEL - Abnormal; Notable for the following:    Glucose, Bld 128 (*)    Creatinine, Ser 1.23 (*)    GFR calc non Af Amer 45 (*)    GFR calc Af Amer 52 (*)    All other components within normal limits    Imaging Review Ct Head Wo Contrast  11/13/2014   CLINICAL DATA:  Acute onset of dizziness and weakness for 1 day. Initial encounter.  EXAM: CT HEAD WITHOUT CONTRAST  TECHNIQUE:  Contiguous axial images were obtained from the base of the skull through the vertex without intravenous contrast.  COMPARISON:  CT of the head performed 04/04/2009  FINDINGS: There is no evidence of acute infarction, mass lesion, or intra- or extra-axial hemorrhage on CT.  Scattered calcification is noted along the tentorium cerebelli and anterior falx cerebri. The posterior fossa, including the cerebellum, brainstem and fourth ventricle, is within normal limits. The third and lateral ventricles, and basal ganglia are unremarkable in appearance. The cerebral hemispheres are symmetric in appearance, with normal gray-white differentiation. No mass effect or midline shift is seen.  There is no evidence of fracture; there is unusual bony exostosis noted along the inferior aspects of both frontal lobes, relatively stable from 2010 and likely benign. The visualized portions of the orbits are within normal limits. Prominent mucosal thickening is noted at the right maxillary sinus and sphenoid sinus, with mucoperiosteal thickening noted at the sphenoid sinus. The remaining paranasal sinuses and mastoid air cells are well-aerated. No significant soft tissue abnormalities are seen.  IMPRESSION: 1. No acute intracranial pathology seen on CT. 2. Focal  prominent mucosal thickening at the right maxillary sinus and sphenoid sinus, with mucoperiosteal thickening at the sphenoid sinus.   Electronically Signed   By: Garald Balding M.D.   On: 11/13/2014 23:09     EKG Interpretation   Date/Time:  Friday November 13 2014 21:32:01 EST Ventricular Rate:  52 PR Interval:  194 QRS Duration: 105 QT Interval:  425 QTC Calculation: 395 R Axis:   -41 Text Interpretation:  Normal sinus rhythm Left anterior fascicular block  Low voltage, precordial leads Consider anterior infarct No significant  change since last tracing 04/04/09 Confirmed by Letia Guidry MD, Andee Poles [phone]) on  11/13/2014 10:51:42 PM      MDM   Final diagnoses:  Vertigo  Maxillary sinusitis, unspecified chronicity   69 year old female with vertigo that began yesterday. She has no lateralizing symptoms on exam. She feels better after Antivert. I have discussed possibility of stroke with the patient and her family. They understand that she should return if she has any worsening of her symptoms. She will should follow-up with her primary care doctor on Monday. They voice understanding and agreement of the plan. CT shows some probable sinusitis and she will be placed on antibiotics for this. I personally performed the services described in this documentation, which was scribed in my presence. The recorded information has been reviewed and considered.  1-vertigo  2-anemia- stable sinusitis    Shaune Pollack, MD 11/13/14 2330

## 2015-01-14 ENCOUNTER — Other Ambulatory Visit (HOSPITAL_COMMUNITY): Payer: Self-pay | Admitting: Family Medicine

## 2015-01-14 DIAGNOSIS — Z1231 Encounter for screening mammogram for malignant neoplasm of breast: Secondary | ICD-10-CM

## 2015-01-22 ENCOUNTER — Ambulatory Visit (HOSPITAL_COMMUNITY)
Admission: RE | Admit: 2015-01-22 | Discharge: 2015-01-22 | Disposition: A | Discharge status: Home / Self Care | Payer: Medicare Other | Source: Ambulatory Visit | Attending: Family Medicine | Admitting: Family Medicine

## 2015-01-22 DIAGNOSIS — Z1231 Encounter for screening mammogram for malignant neoplasm of breast: Secondary | ICD-10-CM

## 2015-02-25 ENCOUNTER — Ambulatory Visit (INDEPENDENT_AMBULATORY_CARE_PROVIDER_SITE_OTHER): Payer: Medicare Other

## 2015-02-25 ENCOUNTER — Ambulatory Visit (INDEPENDENT_AMBULATORY_CARE_PROVIDER_SITE_OTHER): Payer: Medicare Other | Admitting: Orthopedic Surgery

## 2015-02-25 VITALS — BP 120/64 | Ht 67.0 in | Wt 224.0 lb

## 2015-02-25 DIAGNOSIS — M129 Arthropathy, unspecified: Secondary | ICD-10-CM | POA: Diagnosis not present

## 2015-02-25 DIAGNOSIS — M1710 Unilateral primary osteoarthritis, unspecified knee: Secondary | ICD-10-CM

## 2015-02-25 NOTE — Progress Notes (Signed)
Patient ID: [name] [name], female   DOB: 22-Feb-1946, 69 y.o.   MRN: [id number]  Post op annual TKA   Chief Complaint  Patient presents with  . Follow-up    Yearly recheck on right knee replacement with xray. DOS 09-15-08.    HPI [name] [name] is a 69 y.o. female.  Who presents with no complaints related to her right total knee.  Related system review negative   Past Medical History  Diagnosis Date  . Vertigo     history  . Gout     right elbow  . Diabetes mellitus   . Hypertension   . Anemia     Past Surgical History  Procedure Laterality Date  . Abdominal hysterectomy    . Replacement total knee      right     Allergies  Allergen Reactions  . Penicillins Hives    Current Outpatient Prescriptions  Medication Sig Dispense Refill  . allopurinol (ZYLOPRIM) 300 MG tablet Take 300 mg by mouth daily.    . carvedilol (COREG) 25 MG tablet Take 25 mg by mouth 2 (two) times daily with a meal.      . doxycycline (VIBRAMYCIN) 100 MG capsule Take 1 capsule (100 mg total) by mouth 2 (two) times daily. (Patient not taking: Reported on 11/13/2014) 20 capsule 0  . gemfibrozil (LOPID) 600 MG tablet Take 600 mg by mouth 2 (two) times daily before a meal.      . glipiZIDE (GLUCOTROL XL) 10 MG 24 hr tablet Take 10 mg by mouth daily.    Marland Kitchen HYDROcodone-acetaminophen (NORCO/VICODIN) 5-325 MG per tablet Take 1 tablet by mouth every 6 (six) hours as needed for pain. (Patient not taking: Reported on 11/13/2014) 6 tablet 0  . lisinopril-hydrochlorothiazide (PRINZIDE,ZESTORETIC) 20-12.5 MG per tablet Take 1 tablet by mouth daily.      . meclizine (ANTIVERT) 50 MG tablet Take 1 tablet (50 mg total) by mouth 3 (three) times daily as needed. 30 tablet 0  . metFORMIN (GLUCOPHAGE) 1000 MG tablet Take 1,000 mg by mouth daily.    . simvastatin (ZOCOR) 40 MG tablet Take 40 mg by mouth at bedtime.    . sulfamethoxazole-trimethoprim (SEPTRA DS) 800-160 MG per tablet Take 1 tablet by mouth every 12 (twelve) hours. 20  tablet 0  . Vitamin D, Ergocalciferol, (DRISDOL) 50000 UNITS CAPS capsule Take 50,000 Units by mouth every Wednesday.     No current facility-administered medications for this visit.    Review of Systems Review of Systems   Physical Exam Blood pressure 120/64, height 5\' 7"  (1.702 m), weight 224 lb (101.606 kg).  The patient is awake alert and oriented 3 mood and affect normal. General appearance well-groomed. The patient is ambulatory with no assistive device and no effective limp or alteration in gait.  The knee remains stable and the anterior posterior and medial lateral plane. Quadriceps strength is normal. The incision healed well there is no swelling.  Knee flexion 120  Data Reviewed KNEE XRAYS : My interpretation of her films are that she has a stable total knee film with no complications  Assessment S/P TKA   Plan    Repeat x-rays in 1 year       Arther Abbott 02/25/2015, 2:58 PM

## 2016-01-11 ENCOUNTER — Other Ambulatory Visit (HOSPITAL_COMMUNITY): Payer: Self-pay | Admitting: Family Medicine

## 2016-01-11 DIAGNOSIS — Z1231 Encounter for screening mammogram for malignant neoplasm of breast: Secondary | ICD-10-CM

## 2016-01-24 ENCOUNTER — Ambulatory Visit (HOSPITAL_COMMUNITY)
Admission: RE | Admit: 2016-01-24 | Discharge: 2016-01-24 | Disposition: A | Discharge status: Home / Self Care | Payer: Medicare Other | Source: Ambulatory Visit | Attending: Family Medicine | Admitting: Family Medicine

## 2016-01-24 DIAGNOSIS — Z1231 Encounter for screening mammogram for malignant neoplasm of breast: Secondary | ICD-10-CM

## 2016-02-24 ENCOUNTER — Ambulatory Visit: Payer: Medicare Other | Admitting: Orthopedic Surgery

## 2016-03-15 ENCOUNTER — Ambulatory Visit (INDEPENDENT_AMBULATORY_CARE_PROVIDER_SITE_OTHER): Payer: Medicare Other

## 2016-03-15 ENCOUNTER — Encounter: Payer: Self-pay | Admitting: Orthopedic Surgery

## 2016-03-15 ENCOUNTER — Ambulatory Visit (INDEPENDENT_AMBULATORY_CARE_PROVIDER_SITE_OTHER): Payer: Medicare Other | Admitting: Orthopedic Surgery

## 2016-03-15 VITALS — BP 124/66 | Ht 67.0 in | Wt 224.0 lb

## 2016-03-15 DIAGNOSIS — Z96651 Presence of right artificial knee joint: Secondary | ICD-10-CM | POA: Diagnosis not present

## 2016-03-15 NOTE — Progress Notes (Signed)
Patient ID: [name] [name], female   DOB: 1946-02-22, 70 y.o.   MRN: [id number]  Chief Complaint  Patient presents with  . Follow-up    1 YEAR FOLLOW UP + XRAY RT TKA, DOS 09/15/08    HPI 70 year old female with asymptomatic right total knee doing well no problems  Review of Systems  Constitutional: Negative for fever and chills.  Musculoskeletal: Negative for falls.   Past Medical History  Diagnosis Date  . Vertigo     history  . Gout     right elbow  . Diabetes mellitus   . Hypertension   . Anemia      BP 124/66 mmHg  Ht 5\' 7"  (1.702 m)  Wt 224 lb (101.606 kg)  BMI 35.08 kg/m2  Physical Exam  Constitutional: She is oriented to person, place, and time. She appears well-developed and well-nourished. No distress.  Cardiovascular: Normal rate and intact distal pulses.   Neurological: She is alert and oriented to person, place, and time. She has normal reflexes. She exhibits normal muscle tone. Coordination normal.  Skin: Skin is warm and dry. No rash noted. She is not diaphoretic. No erythema. No pallor.  Psychiatric: She has a normal mood and affect. Her behavior is normal. Judgment and thought content normal.    Right Knee Exam   Tenderness  The patient is experiencing no tenderness.     Muscle Strength   The patient has normal right knee strength.  Tests  Drawer:       Anterior - negative    Posterior - negative Varus: negative Valgus: negative  Other  Erythema: absent Scars: absent Sensation: normal Pulse: present Swelling: none   Left Knee Exam   Tenderness  The patient is experiencing no tenderness.     Range of Motion  The patient has normal left knee ROM.  Muscle Strength   The patient has normal left knee strength.  Tests  Drawer:       Anterior - negative     Posterior - negative  Other  Erythema: absent Scars: absent Sensation: normal Pulse: present Swelling: none        ASSESSMENT AND PLAN   Radiology report  3 views  right  knee  AP lateral and patellar sunrise views of the knee  FINDINGS: A total knee prosthesis is noted. It is in anatomic alignment. There is no evidence of loosening.  Impression : normal TKA  xray

## 2016-08-04 ENCOUNTER — Encounter (HOSPITAL_COMMUNITY): Payer: Self-pay

## 2016-08-04 ENCOUNTER — Emergency Department (HOSPITAL_COMMUNITY)
Admission: EM | Admit: 2016-08-04 | Discharge: 2016-08-04 | Disposition: A | Discharge status: Home / Self Care | Payer: Medicare Other | Attending: Emergency Medicine | Admitting: Emergency Medicine

## 2016-08-04 DIAGNOSIS — R110 Nausea: Secondary | ICD-10-CM | POA: Insufficient documentation

## 2016-08-04 DIAGNOSIS — Z7984 Long term (current) use of oral hypoglycemic drugs: Secondary | ICD-10-CM | POA: Insufficient documentation

## 2016-08-04 DIAGNOSIS — R42 Dizziness and giddiness: Secondary | ICD-10-CM | POA: Diagnosis not present

## 2016-08-04 DIAGNOSIS — I10 Essential (primary) hypertension: Secondary | ICD-10-CM | POA: Insufficient documentation

## 2016-08-04 DIAGNOSIS — E119 Type 2 diabetes mellitus without complications: Secondary | ICD-10-CM | POA: Insufficient documentation

## 2016-08-04 DIAGNOSIS — Z79899 Other long term (current) drug therapy: Secondary | ICD-10-CM | POA: Diagnosis not present

## 2016-08-04 LAB — CBC WITH DIFFERENTIAL/PLATELET
BASOS ABS: 0 10*3/uL (ref 0.0–0.1)
BASOS PCT: 1 %
EOS PCT: 4 %
Eosinophils Absolute: 0.2 10*3/uL (ref 0.0–0.7)
HCT: 35.5 % — ABNORMAL LOW (ref 36.0–46.0)
Hemoglobin: 11.7 g/dL — ABNORMAL LOW (ref 12.0–15.0)
Lymphocytes Relative: 35 %
Lymphs Abs: 1.6 10*3/uL (ref 0.7–4.0)
MCH: 30.5 pg (ref 26.0–34.0)
MCHC: 33 g/dL (ref 30.0–36.0)
MCV: 92.4 fL (ref 78.0–100.0)
MONOS PCT: 9 %
Monocytes Absolute: 0.4 10*3/uL (ref 0.1–1.0)
Neutro Abs: 2.3 10*3/uL (ref 1.7–7.7)
Neutrophils Relative %: 51 %
PLATELETS: 217 10*3/uL (ref 150–400)
RBC: 3.84 MIL/uL — ABNORMAL LOW (ref 3.87–5.11)
RDW: 13.1 % (ref 11.5–15.5)
WBC: 4.6 10*3/uL (ref 4.0–10.5)

## 2016-08-04 LAB — HEPATIC FUNCTION PANEL
ALBUMIN: 4.2 g/dL (ref 3.5–5.0)
ALT: 9 U/L — ABNORMAL LOW (ref 14–54)
AST: 15 U/L (ref 15–41)
Alkaline Phosphatase: 61 U/L (ref 38–126)
BILIRUBIN INDIRECT: 0.3 mg/dL (ref 0.3–0.9)
Bilirubin, Direct: 0.1 mg/dL (ref 0.1–0.5)
Total Bilirubin: 0.4 mg/dL (ref 0.3–1.2)
Total Protein: 7.3 g/dL (ref 6.5–8.1)

## 2016-08-04 LAB — URINALYSIS, ROUTINE W REFLEX MICROSCOPIC
BILIRUBIN URINE: NEGATIVE
Glucose, UA: NEGATIVE mg/dL
HGB URINE DIPSTICK: NEGATIVE
Ketones, ur: NEGATIVE mg/dL
Leukocytes, UA: NEGATIVE
Nitrite: NEGATIVE
PROTEIN: NEGATIVE mg/dL
Specific Gravity, Urine: 1.025 (ref 1.005–1.030)
pH: 5.5 (ref 5.0–8.0)

## 2016-08-04 LAB — BASIC METABOLIC PANEL
ANION GAP: 9 (ref 5–15)
BUN: 14 mg/dL (ref 6–20)
CO2: 24 mmol/L (ref 22–32)
CREATININE: 1.28 mg/dL — AB (ref 0.44–1.00)
Calcium: 9.8 mg/dL (ref 8.9–10.3)
Chloride: 106 mmol/L (ref 101–111)
GFR, EST AFRICAN AMERICAN: 49 mL/min — AB (ref >60)
GFR, EST NON AFRICAN AMERICAN: 42 mL/min — AB (ref >60)
Glucose, Bld: 162 mg/dL — ABNORMAL HIGH (ref 65–99)
Potassium: 4.1 mmol/L (ref 3.5–5.1)
Sodium: 139 mmol/L (ref 135–145)

## 2016-08-04 LAB — TROPONIN I
Troponin I: <0.03 ng/mL (ref <0.03)
Troponin I: <0.03 ng/mL (ref <0.03)

## 2016-08-04 LAB — LIPASE, BLOOD: Lipase: 30 U/L (ref 11–51)

## 2016-08-04 MED ORDER — MECLIZINE HCL 12.5 MG PO TABS
25.0000 mg | ORAL_TABLET | Freq: Once | ORAL | Status: AC
  Administered 2016-08-04: 25 mg via ORAL
  Filled 2016-08-04: qty 2

## 2016-08-04 MED ORDER — ONDANSETRON HCL 4 MG/2ML IJ SOLN
4.0000 mg | Freq: Once | INTRAMUSCULAR | Status: AC
  Administered 2016-08-04: 4 mg via INTRAVENOUS
  Filled 2016-08-04: qty 2

## 2016-08-04 MED ORDER — ONDANSETRON HCL 4 MG PO TABS: 4.0000 mg | ORAL_TABLET | Freq: Four times a day (QID) | ORAL | 0 refills | Status: DC

## 2016-08-04 MED ORDER — SODIUM CHLORIDE 0.9 % IV BOLUS (SEPSIS)
1000.0000 mL | Freq: Once | INTRAVENOUS | Status: AC
  Administered 2016-08-04: 1000 mL via INTRAVENOUS

## 2016-08-04 MED ORDER — MECLIZINE HCL 12.5 MG PO TABS: 12.5000 mg | ORAL_TABLET | Freq: Three times a day (TID) | ORAL | 0 refills | Status: DC | PRN

## 2016-08-04 NOTE — ED Provider Notes (Signed)
[facility] DEPT Provider Note   CSN: [id number] Arrival date & time: 08/04/16  [id number]     History   Chief Complaint Chief Complaint  Patient presents with  . Hypertension    nausea    HPI [name] [name] is a 70 y.o. female.  Patient presents with feeling of nausea and lightheadedness upon waking up this morning. She describes some dizziness and lightheadedness with standing and feels like she was going to throw up but has not. Had one episode of nonbloody diarrhea. Denies abdominal pain, chest pain, fever, chills. She has had vertigo in the past and this feels similar though she denies any room spinning dizziness. She ate some ice cream last night which is unusual for her. Endorses some lightheadedness upon standing. There is no vision unchanged. There is no headache. There is no chest pain, shortness of breath, vomiting. No focal weakness, numbness or tingling.   The history is provided by the patient.  Hypertension  Pertinent negatives include no chest pain, no abdominal pain, no headaches and no shortness of breath.    Past Medical History:  Diagnosis Date  . Anemia   . Diabetes mellitus   . Gout    right elbow  . Hypertension   . Vertigo    history    Patient Active Problem List   Diagnosis Date Noted  . Gout   . Diabetes mellitus   . Hypertension   . Anemia   . NEOPLASM, SKIN, UNCERTAIN BEHAVIOR 0000000  . UNSPECIFIED ANEMIA 04/06/2009  . DIARRHEA 04/06/2009  . LEUKOCYTOPENIA UNSPECIFIED 02/19/2009  . GASTROENTERITIS 01/13/2009  . HYPERLIPIDEMIA 11/20/2008  . ANEMIA, IRON DEFICIENCY 11/20/2008  . CATARACTS 11/20/2008  . HYPERTENSION 11/20/2008  . ALLERGIC RHINITIS 11/20/2008  . GERD 11/20/2008  . TOTAL KNEE FOLLOW-UP 09/29/2008  . Osteoarthrosis, unspecified whether generalized or localized, lower leg 08/26/2007  . DIABETES 08/23/2007    Past Surgical History:  Procedure Laterality Date  . ABDOMINAL HYSTERECTOMY    . REPLACEMENT TOTAL KNEE     right    OB History    No data available       Home Medications    Prior to Admission medications   Medication Sig Start Date End Date Taking? Authorizing Provider  carvedilol (COREG) 25 MG tablet Take 25 mg by mouth 2 (two) times daily with a meal.     Yes Historical Provider, MD  gemfibrozil (LOPID) 600 MG tablet Take 600 mg by mouth 2 (two) times daily before a meal.     Yes Historical Provider, MD  glipiZIDE (GLUCOTROL XL) 10 MG 24 hr tablet Take 10 mg by mouth daily.   Yes Historical Provider, MD  metFORMIN (GLUCOPHAGE) 1000 MG tablet Take 1,000 mg by mouth daily. 09/23/14  Yes Historical Provider, MD  simvastatin (ZOCOR) 40 MG tablet Take 40 mg by mouth at bedtime. 07/14/13  Yes Historical Provider, MD  Vitamin D, Ergocalciferol, (DRISDOL) 50000 UNITS CAPS capsule Take 50,000 Units by mouth every Wednesday.   Yes Historical Provider, MD  allopurinol (ZYLOPRIM) 300 MG tablet Take 300 mg by mouth daily. 07/14/13   Historical Provider, MD  lisinopril-hydrochlorothiazide (PRINZIDE,ZESTORETIC) 20-12.5 MG per tablet Take 1 tablet by mouth daily.      Historical Provider, MD  meclizine (ANTIVERT) 50 MG tablet Take 1 tablet (50 mg total) by mouth 3 (three) times daily as needed. 11/13/14   Pattricia Boss, MD  sulfamethoxazole-trimethoprim (SEPTRA DS) 800-160 MG per tablet Take 1 tablet by mouth every 12 (twelve) hours. 11/13/14  Pattricia Boss, MD    Family History No family history on file.  Social History Social History  Substance Use Topics  . Smoking status: Never Smoker  . Smokeless tobacco: Never Used  . Alcohol use No     Allergies   Penicillins   Review of Systems Review of Systems  Constitutional: Positive for fatigue. Negative for activity change, appetite change and fever.  HENT: Negative for congestion and rhinorrhea.   Respiratory: Negative for cough, chest tightness and shortness of breath.   Cardiovascular: Negative for chest pain.  Gastrointestinal: Positive for  nausea. Negative for abdominal pain and vomiting.  Genitourinary: Negative for dysuria, hematuria, vaginal bleeding and vaginal discharge.  Musculoskeletal: Negative for arthralgias and myalgias.  Neurological: Positive for dizziness, weakness and light-headedness. Negative for headaches.  A complete 10 system review of systems was obtained and all systems are negative except as noted in the HPI and PMH.     Physical Exam Updated Vital Signs BP 156/79   Pulse 65   Temp 97.5 F (36.4 C) (Oral)   Resp (!) 9   Ht 5\' 7"  (1.702 m)   Wt 232 lb (105.2 kg)   SpO2 97%   BMI 36.34 kg/m   Physical Exam  Constitutional: She is oriented to person, place, and time. She appears well-developed and well-nourished. No distress.  HENT:  Head: Normocephalic and atraumatic.  Mouth/Throat: Oropharynx is clear and moist. No oropharyngeal exudate.  Eyes: Conjunctivae and EOM are normal. Pupils are equal, round, and reactive to light.  Neck: Normal range of motion. Neck supple.  No meningismus.  Cardiovascular: Normal rate, regular rhythm, normal heart sounds and intact distal pulses.   No murmur heard. Pulmonary/Chest: Effort normal and breath sounds normal. No respiratory distress.  Abdominal: Soft. There is no tenderness. There is no rebound and no guarding.  Musculoskeletal: Normal range of motion. She exhibits no edema or tenderness.  Neurological: She is alert and oriented to person, place, and time. No cranial nerve deficit. She exhibits normal muscle tone. Coordination normal.  No ataxia on finger to nose bilaterally. No pronator drift. 5/5 strength throughout. CN 2-12 intact.Equal grip strength. Sensation intact.  No nystagmus, negative Romberg, normal gait  Skin: Skin is warm.  Psychiatric: She has a normal mood and affect. Her behavior is normal.  Nursing note and vitals reviewed.    ED Treatments / Results  Labs (all labs ordered are listed, but only abnormal results are  displayed) Labs Reviewed  CBC WITH DIFFERENTIAL/PLATELET - Abnormal; Notable for the following:       Result Value   RBC 3.84 (*)    Hemoglobin 11.7 (*)    HCT 35.5 (*)    All other components within normal limits  BASIC METABOLIC PANEL - Abnormal; Notable for the following:    Glucose, Bld 162 (*)    Creatinine, Ser 1.28 (*)    GFR calc non Af Amer 42 (*)    GFR calc Af Amer 49 (*)    All other components within normal limits  HEPATIC FUNCTION PANEL - Abnormal; Notable for the following:    ALT 9 (*)    All other components within normal limits  URINALYSIS, ROUTINE W REFLEX MICROSCOPIC (NOT AT [facility])  TROPONIN I  LIPASE, BLOOD  TROPONIN I    EKG  EKG Interpretation  Date/Time:  Friday August 04 2016 06:55:12 EDT Ventricular Rate:  62 PR Interval:    QRS Duration: 112 QT Interval:  402 QTC Calculation: 409 R  Axis:   -47 Text Interpretation:  Sinus rhythm Left anterior fascicular block Low voltage, precordial leads Abnormal R-wave progression, early transition v3 T wave now upright Confirmed by Wyvonnia Dusky  MD, Daylan Boggess [phone]) on 08/04/2016 7:01:16 AM       Radiology No results found.  Procedures Procedures (including critical care time)  Medications Ordered in ED Medications  sodium chloride 0.9 % bolus 1,000 mL (not administered)  ondansetron (ZOFRAN) injection 4 mg (not administered)     Initial Impression / Assessment and Plan / ED Course  I have reviewed the triage vital signs and the nursing notes.  Pertinent labs & imaging results that were available during my care of the patient were reviewed by me and considered in my medical decision making (see chart for details).  Clinical Course   Nausea and lightheadedness since waking up this morning.  No chest pain, abdominal pain. No focal weakness, numbness, tingling.  UA negative. Hemoglobin improved from previous. Orthostatics negative. IVF and IV zofran given.  Labs unremarkable. Troponin negative.  Troponin at 9 AM which is 3-1/2 hours since symptom onset is negative. Patient denies chest pain or shortness of breath. Her dizziness has improved. She is tolerating by mouth and ambulatory.  We'll discharge with antiemetics and meclizine. Follow-up with PCP. Return precautions discussed.  Final Clinical Impressions(s) / ED Diagnoses   Final diagnoses:  Nausea  Dizziness    New Prescriptions New Prescriptions   No medications on file     Ezequiel Essex, MD 08/04/16 1539

## 2016-08-04 NOTE — ED Notes (Signed)
Sitting up drinking drink.  States she feels better.

## 2016-08-04 NOTE — ED Notes (Signed)
Pt ambulated without difficulty.  Drinking diet gingerale.

## 2016-08-04 NOTE — ED Triage Notes (Signed)
Pt states she awoke feeling a little funny-nausea, took her blood pressure at home and 190's/90's

## 2016-08-04 NOTE — Discharge Instructions (Signed)
Your lab work looks stable. There is no signs of any damage to your heart. Followup with your doctor. Keep yourself hydrated. Return to the ED if you develop new or worsening symptoms.

## 2016-08-04 NOTE — ED Notes (Signed)
Drank diet gingerale without difficulty.

## 2017-01-08 ENCOUNTER — Other Ambulatory Visit (HOSPITAL_COMMUNITY): Payer: Self-pay | Admitting: Family Medicine

## 2017-01-08 DIAGNOSIS — Z1231 Encounter for screening mammogram for malignant neoplasm of breast: Secondary | ICD-10-CM

## 2017-01-25 ENCOUNTER — Ambulatory Visit (HOSPITAL_COMMUNITY): Payer: Medicare Other

## 2017-02-01 ENCOUNTER — Ambulatory Visit (HOSPITAL_COMMUNITY)
Admission: RE | Admit: 2017-02-01 | Discharge: 2017-02-01 | Disposition: A | Discharge status: Home / Self Care | Payer: Medicare Other | Source: Ambulatory Visit | Attending: Family Medicine | Admitting: Family Medicine

## 2017-02-01 DIAGNOSIS — Z1231 Encounter for screening mammogram for malignant neoplasm of breast: Secondary | ICD-10-CM | POA: Insufficient documentation

## 2017-03-15 ENCOUNTER — Ambulatory Visit: Payer: Medicare Other | Admitting: Orthopedic Surgery

## 2017-03-20 ENCOUNTER — Ambulatory Visit: Payer: Medicare Other | Admitting: Orthopedic Surgery

## 2017-03-26 ENCOUNTER — Other Ambulatory Visit: Payer: Self-pay | Admitting: Radiology

## 2017-03-26 DIAGNOSIS — M1710 Unilateral primary osteoarthritis, unspecified knee: Secondary | ICD-10-CM

## 2017-03-27 ENCOUNTER — Ambulatory Visit (INDEPENDENT_AMBULATORY_CARE_PROVIDER_SITE_OTHER): Payer: Medicare Other | Admitting: Orthopedic Surgery

## 2017-03-27 ENCOUNTER — Ambulatory Visit (INDEPENDENT_AMBULATORY_CARE_PROVIDER_SITE_OTHER): Payer: Medicare Other

## 2017-03-27 DIAGNOSIS — Z96651 Presence of right artificial knee joint: Secondary | ICD-10-CM

## 2017-03-27 DIAGNOSIS — M1710 Unilateral primary osteoarthritis, unspecified knee: Secondary | ICD-10-CM | POA: Diagnosis not present

## 2017-03-27 NOTE — Progress Notes (Signed)
ANNUAL FOLLOW UP FOR  Right  TKA   Chief Complaint  Patient presents with  . Follow-up    RT TKA, DOS 09/15/08     HPI: The patient is here for the annual  follow-up x-ray for knee replacement   Review of Systems  Constitutional: Negative for chills and fever.  Musculoskeletal: Negative for joint pain.  Neurological: Positive for tingling.      Examination of the right  KNEE   Inspection shows : incision healed nicely without erythema, no tenderness no swelling  Range of motion total range of motion is 125  Stability the knee is stable anterior to posterior as well as medial to lateral  Strength quadriceps strength is normal  Skin no erythema around the skin incision  Cardiovascular NO EDEMA    Medical decision-making section  X-rays ordered with the following personal interpretation  Normal alignment without loosening   Diagnosis  Encounter Diagnosis  Name Primary?  . History of right knee joint replacement Yes     Plan follow-up : she has opted for prn fu

## 2017-11-14 ENCOUNTER — Encounter: Payer: Self-pay | Admitting: Gastroenterology

## 2017-12-24 ENCOUNTER — Ambulatory Visit: Payer: Medicare Other

## 2017-12-31 ENCOUNTER — Ambulatory Visit (INDEPENDENT_AMBULATORY_CARE_PROVIDER_SITE_OTHER): Payer: Self-pay

## 2017-12-31 DIAGNOSIS — Z1211 Encounter for screening for malignant neoplasm of colon: Secondary | ICD-10-CM

## 2017-12-31 MED ORDER — PEG 3350-KCL-NA BICARB-NACL 420 G PO SOLR: 4000.0000 mL | ORAL | 0 refills | Status: DC

## 2017-12-31 NOTE — Progress Notes (Signed)
Gastroenterology Pre-Procedure Review  Request Date:12/31/17 Requesting Physician: 10 year recall  (last tcs- [facility]- 12/13/2007 no polyps)  PATIENT REVIEW QUESTIONS: The patient responded to the following health history questions as indicated:    1. Diabetes Melitis: yes (metformin and glipizide) 2. Joint replacements in the past 12 months: no 3. Major health problems in the past 3 months: no 4. Has an artificial valve or MVP: no 5. Has a defibrillator: no 6. Has been advised in past to take antibiotics in advance of a procedure like teeth cleaning: no 7. Family history of colon cancer: yes (sister age 66)  41. Alcohol Use: no 9. History of sleep apnea: no  10. History of coronary artery or other vascular stents placed within the last 12 months: no 11. History of any prior anesthesia complications: no    MEDICATIONS & ALLERGIES:    Patient reports the following regarding taking any blood thinners:   Plavix? no Aspirin? no Coumadin? no Brilinta? no Xarelto? no Eliquis? no Pradaxa? no Savaysa? no Effient? no  Patient confirms/reports the following medications:  Current Outpatient Medications  Medication Sig Dispense Refill  . carvedilol (COREG) 25 MG tablet Take 25 mg by mouth 2 (two) times daily with a meal.      . gemfibrozil (LOPID) 600 MG tablet Take 600 mg by mouth 2 (two) times daily before a meal.      . glipiZIDE (GLUCOTROL XL) 10 MG 24 hr tablet Take 10 mg by mouth daily.    Marland Kitchen lisinopril (PRINIVIL,ZESTRIL) 20 MG tablet 20 mg daily.    . metFORMIN (GLUCOPHAGE) 1000 MG tablet Take 1,000 mg by mouth daily.    . simvastatin (ZOCOR) 40 MG tablet Take 40 mg by mouth at bedtime.     No current facility-administered medications for this visit.     Patient confirms/reports the following allergies:  Allergies  Allergen Reactions  . Penicillins Hives    No orders of the defined types were placed in this encounter.   AUTHORIZATION INFORMATION Primary Insurance:[employer] medicare  ,  ID #: [id number] Pre-Cert / Auth required:no   SCHEDULE INFORMATION: Procedure has been scheduled as follows:  Date: 02/04/18, Time: 9:30 Location: [facility] Dr.Fields  This Gastroenterology Pre-Precedure Review Form is being routed to the following provider(s): Roseanne Kaufman NP

## 2017-12-31 NOTE — Patient Instructions (Signed)
[name] [name]   1945-11-20 MRN: [id number]    Procedure Date: 02/04/18 Time to register: 8:30 Place to register: [facility] Procedure Time: 9:30 Scheduled provider: Dr.Fields  PREPARATION FOR COLONOSCOPY WITH TRI-LYTE SPLIT PREP  Please notify us immediately if you are diabetic, take iron supplements, or if you are on Coumadin or any other blood thinners.   Please hold the following medications: I will send you a letter with this information  You will need to purchase 1 fleet enema and 1 box of Bisacodyl 62m tablets.    1 DAY BEFORE PROCEDURE:  DATE: 02/03/18   DAY: Sunday  clear liquids the entire day - NO SOLID FOOD.   Diabetic medications adjustments for today: see letter  At 2:00 pm:  Take 2 Bisacodyl tablets.   At 4:00pm:  Start drinking your solution. Make sure you mix well per instructions on the bottle. Try to drink 1 (one) 8 ounce glass every 10-15 minutes until you have consumed HALF the jug. You should complete by 6:00pm.You must keep the left over solution refrigerated until completed next day.  Continue clear liquids. You must drink plenty of clear liquids to prevent dehyration and kidney failure. Nothing to eat or drink after midnight.  EXCEPTION: If you take medications for your heart, blood pressure or breathing, you may take these medications with a small amount of clear liquid.    DAY OF PROCEDURE:   DATE: 02/04/18   DAY: Monday  Diabetic medications adjustments for today: see letter  Five hours before your procedure time @ 4:30am:  Finish remaining amout of bowel prep, drinking 1 (one) 8 ounce glass every 10-15 minutes until complete. You have two hours to consume remaining prep.   Three hours before your procedure time '@6' :30am:  Nothing by mouth.   At least one hour before going to the hospital:  Give yourself one Fleet enema. You may take your morning medications with sip of water unless we have instructed otherwise.      Please see below for  Dietary Information.  CLEAR LIQUIDS INCLUDE:  Water Jello (NOT red in color)   Ice Popsicles (NOT red in color)   Tea (sugar ok, no milk/cream) Powdered fruit flavored drinks  Coffee (sugar ok, no milk/cream) Gatorade/ Lemonade/ Kool-Aid  (NOT red in color)   Juice: apple, white grape, white cranberry Soft drinks  Clear bullion, consomme, broth (fat free beef/chicken/vegetable)  Carbonated beverages (any kind)  Strained chicken noodle soup Hard Candy   Remember: Clear liquids are liquids that will allow you to see your fingers on the other side of a clear glass. Be sure liquids are NOT red in color, and not cloudy, but CLEAR.  DO NOT EAT OR DRINK ANY OF THE FOLLOWING:  Dairy products of any kind   Cranberry juice Tomato juice / V8 juice   Grapefruit juice Orange juice     Red grape juice  Do not eat any solid foods, including such foods as: cereal, oatmeal, yogurt, fruits, vegetables, creamed soups, eggs, bread, crackers, pureed foods in a blender, etc.   HELPFUL HINTS FOR DRINKING PREP SOLUTION:   Make sure prep is extremely cold. Mix and refrigerate the the morning of the prep. You may also put in the freezer.   You may try mixing some Crystal Light or Country Time Lemonade if you prefer. Mix in small amounts; add more if necessary.  Try drinking through a straw  Rinse mouth with water or a mouthwash between glasses, to  remove after-taste.  Try sipping on a cold beverage /ice/ popsicles between glasses of prep.  Place a piece of sugar-free hard candy in mouth between glasses.  If you become nauseated, try consuming smaller amounts, or stretch out the time between glasses. Stop for 30-60 minutes, then slowly start back drinking.        OTHER INSTRUCTIONS  You will need a responsible adult at least 70 years of age to accompany you and drive you home. This person must remain in the waiting room during your procedure. The hospital will cancel your procedure if you do not  have a responsible adult with you.   1. Wear loose fitting clothing that is easily removed. 2. Leave jewelry and other valuables at home.  3. Remove all body piercing jewelry and leave at home. 4. Total time from sign-in until discharge is approximately 2-3 hours. 5. You should go home directly after your procedure and rest. You can resume normal activities the day after your procedure. 6. The day of your procedure you should not:  Drive  Make legal decisions  Operate machinery  Drink alcohol  Return to work   You may call the office (Dept: [phone]) before 5:00pm, or page the doctor on call [phone]) after 5:00pm, for further instructions, if necessary.   Insurance Information YOU WILL NEED TO CHECK WITH YOUR INSURANCE COMPANY FOR THE BENEFITS OF COVERAGE YOU HAVE FOR THIS PROCEDURE.  UNFORTUNATELY, NOT ALL INSURANCE COMPANIES HAVE BENEFITS TO COVER ALL OR PART OF THESE TYPES OF PROCEDURES.  IT IS YOUR RESPONSIBILITY TO CHECK YOUR BENEFITS, HOWEVER, WE WILL BE GLAD TO ASSIST YOU WITH ANY CODES YOUR INSURANCE COMPANY MAY NEED.    PLEASE NOTE THAT MOST INSURANCE COMPANIES WILL NOT COVER A SCREENING COLONOSCOPY FOR PEOPLE UNDER THE AGE OF 50  IF YOU HAVE [employer] INSURANCE, YOU MAY HAVE BENEFITS FOR A SCREENING COLONOSCOPY BUT IF POLYPS ARE FOUND THE DIAGNOSIS WILL CHANGE AND THEN YOU MAY HAVE A DEDUCTIBLE THAT WILL NEED TO BE MET. SO PLEASE MAKE SURE YOU CHECK YOUR BENEFITS FOR A SCREENING COLONOSCOPY AS WELL AS A DIAGNOSTIC COLONOSCOPY.

## 2018-01-02 NOTE — Progress Notes (Signed)
Letter mailed to the pt. 

## 2018-01-02 NOTE — Progress Notes (Signed)
No glipizide or metformin day of procedure.

## 2018-01-09 ENCOUNTER — Other Ambulatory Visit (HOSPITAL_COMMUNITY): Payer: Self-pay | Admitting: Family Medicine

## 2018-01-09 DIAGNOSIS — Z1231 Encounter for screening mammogram for malignant neoplasm of breast: Secondary | ICD-10-CM

## 2018-02-04 ENCOUNTER — Other Ambulatory Visit: Payer: Self-pay

## 2018-02-04 ENCOUNTER — Encounter (HOSPITAL_COMMUNITY): Payer: Self-pay | Admitting: *Deleted

## 2018-02-04 ENCOUNTER — Ambulatory Visit (HOSPITAL_COMMUNITY)
Admission: RE | Admit: 2018-02-04 | Discharge: 2018-02-04 | Disposition: A | Discharge status: Home / Self Care | Payer: Medicare Other | Source: Ambulatory Visit | Attending: Gastroenterology | Admitting: Gastroenterology

## 2018-02-04 ENCOUNTER — Encounter (HOSPITAL_COMMUNITY)
Admission: RE | Disposition: A | Discharge status: Home / Self Care | Payer: Self-pay | Source: Ambulatory Visit | Attending: Gastroenterology

## 2018-02-04 DIAGNOSIS — D126 Benign neoplasm of colon, unspecified: Secondary | ICD-10-CM | POA: Insufficient documentation

## 2018-02-04 DIAGNOSIS — I10 Essential (primary) hypertension: Secondary | ICD-10-CM | POA: Diagnosis not present

## 2018-02-04 DIAGNOSIS — D649 Anemia, unspecified: Secondary | ICD-10-CM | POA: Diagnosis not present

## 2018-02-04 DIAGNOSIS — E119 Type 2 diabetes mellitus without complications: Secondary | ICD-10-CM | POA: Diagnosis not present

## 2018-02-04 DIAGNOSIS — K5730 Diverticulosis of large intestine without perforation or abscess without bleeding: Secondary | ICD-10-CM | POA: Diagnosis not present

## 2018-02-04 DIAGNOSIS — Z1212 Encounter for screening for malignant neoplasm of rectum: Secondary | ICD-10-CM | POA: Diagnosis not present

## 2018-02-04 DIAGNOSIS — D123 Benign neoplasm of transverse colon: Secondary | ICD-10-CM | POA: Diagnosis not present

## 2018-02-04 DIAGNOSIS — K648 Other hemorrhoids: Secondary | ICD-10-CM | POA: Insufficient documentation

## 2018-02-04 DIAGNOSIS — Z79899 Other long term (current) drug therapy: Secondary | ICD-10-CM | POA: Diagnosis not present

## 2018-02-04 DIAGNOSIS — D124 Benign neoplasm of descending colon: Secondary | ICD-10-CM | POA: Diagnosis not present

## 2018-02-04 DIAGNOSIS — Z7984 Long term (current) use of oral hypoglycemic drugs: Secondary | ICD-10-CM | POA: Diagnosis not present

## 2018-02-04 DIAGNOSIS — Z1211 Encounter for screening for malignant neoplasm of colon: Secondary | ICD-10-CM | POA: Diagnosis not present

## 2018-02-04 DIAGNOSIS — E7800 Pure hypercholesterolemia, unspecified: Secondary | ICD-10-CM | POA: Insufficient documentation

## 2018-02-04 HISTORY — PX: COLONOSCOPY: SHX5424

## 2018-02-04 HISTORY — DX: Pure hypercholesterolemia, unspecified: E78.00

## 2018-02-04 LAB — GLUCOSE, CAPILLARY
GLUCOSE-CAPILLARY: 113 mg/dL — AB (ref 65–99)
Glucose-Capillary: 79 mg/dL (ref 65–99)

## 2018-02-04 SURGERY — COLONOSCOPY
Anesthesia: Moderate Sedation

## 2018-02-04 MED ORDER — STERILE WATER FOR IRRIGATION IR SOLN
Status: DC | PRN
  Administered 2018-02-04: 100 mL

## 2018-02-04 MED ORDER — MEPERIDINE HCL 100 MG/ML IJ SOLN
INTRAMUSCULAR | Status: AC
  Filled 2018-02-04: qty 2

## 2018-02-04 MED ORDER — MIDAZOLAM HCL 5 MG/5ML IJ SOLN
INTRAMUSCULAR | Status: AC
  Filled 2018-02-04: qty 10

## 2018-02-04 MED ORDER — MIDAZOLAM HCL 5 MG/5ML IJ SOLN
INTRAMUSCULAR | Status: DC | PRN
  Administered 2018-02-04 (×2): 2 mg via INTRAVENOUS

## 2018-02-04 MED ORDER — MEPERIDINE HCL 100 MG/ML IJ SOLN
INTRAMUSCULAR | Status: DC | PRN
  Administered 2018-02-04 (×2): 25 mg via INTRAVENOUS

## 2018-02-04 MED ORDER — SODIUM CHLORIDE 0.9 % IV SOLN
INTRAVENOUS | Status: DC
  Administered 2018-02-04: 09:00:00 via INTRAVENOUS

## 2018-02-04 MED ORDER — DEXTROSE-NACL 5-0.9 % IV SOLN
INTRAVENOUS | Status: DC
  Administered 2018-02-04: 09:00:00 via INTRAVENOUS

## 2018-02-04 NOTE — Discharge Instructions (Signed)
You have small internal hemorrhoids and diverticulosis IN YOUR LEFT COLON. YOU HAD FOUR POLYPS REMOVED.     DRINK WATER TO KEEP YOUR URINE LIGHT YELLOW.  CONTINUE YOUR WEIGHT LOSS EFFORTS. YOUR BODY MASS INDEX IS OVER 30 WHICH MEANS YOU ARE OBESE. OBESITY IS ASSOCIATED WITH AN INCREASED FOR CIRRHOSIS AND ALL CANCERS, INCLUDING ESOPHAGEAL AND COLON CANCER.  A WEIGHT OF 190 LBS OR LESS WILL KEEP YOUR BODY MASS INDEX(BMI) UNDER 30.   FOLLOW A HIGH FIBER DIET. AVOID ITEMS THAT CAUSE BLOATING. See info below.  YOUR BIOPSY RESULTS WILL BE AVAILABLE IN 7 DAYS.   USE PREPARATION H FOUR TIMES  A DAY IF NEEDED TO RELIEVE RECTAL PAIN/PRESSURE/BLEEDING.  Next colonoscopy in 3-5 years.  Colonoscopy Care After Read the instructions outlined below and refer to this sheet in the next week. These discharge instructions provide you with general information on caring for yourself after you leave the hospital. While your treatment has been planned according to the most current medical practices available, unavoidable complications occasionally occur. If you have any problems or questions after discharge, call DR. Tieler Cournoyer, [phone].  ACTIVITY  You may resume your regular activity, but move at a slower pace for the next 24 hours.   Take frequent rest periods for the next 24 hours.   Walking will help get rid of the air and reduce the bloated feeling in your belly (abdomen).   No driving for 24 hours (because of the medicine (anesthesia) used during the test).   You may shower.   Do not sign any important legal documents or operate any machinery for 24 hours (because of the anesthesia used during the test).    NUTRITION  Drink plenty of fluids.   You may resume your normal diet as instructed by your doctor.   Begin with a light meal and progress to your normal diet. Heavy or fried foods are harder to digest and may make you feel sick to your stomach (nauseated).   Avoid alcoholic beverages  for 24 hours or as instructed.    MEDICATIONS  You may resume your normal medications.   WHAT YOU CAN EXPECT TODAY  Some feelings of bloating in the abdomen.   Passage of more gas than usual.   Spotting of blood in your stool or on the toilet paper  .  IF YOU HAD POLYPS REMOVED DURING THE COLONOSCOPY:  Eat a soft diet IF YOU HAVE NAUSEA, BLOATING, ABDOMINAL PAIN, OR VOMITING.    FINDING OUT THE RESULTS OF YOUR TEST Not all test results are available during your visit. DR. Oneida Alar WILL CALL YOU WITHIN 14 DAYS OF YOUR PROCEDUE WITH YOUR RESULTS. Do not assume everything is normal if you have not heard from DR. Kyriana Yankee, CALL HER OFFICE AT [phone].  SEEK IMMEDIATE MEDICAL ATTENTION AND CALL THE OFFICE: [phone] IF:  You have more than a spotting of blood in your stool.   Your belly is swollen (abdominal distention).   You are nauseated or vomiting.   You have a temperature over 101F.   You have abdominal pain or discomfort that is severe or gets worse throughout the day.  High-Fiber Diet A high-fiber diet changes your normal diet to include more whole grains, legumes, fruits, and vegetables. Changes in the diet involve replacing refined carbohydrates with unrefined foods. The calorie level of the diet is essentially unchanged. The Dietary Reference Intake (recommended amount) for adult males is 38 grams per day. For adult females, it is 25 grams per day.  Pregnant and lactating women should consume 28 grams of fiber per day. Fiber is the intact part of a plant that is not broken down during digestion. Functional fiber is fiber that has been isolated from the plant to provide a beneficial effect in the body. PURPOSE  Increase stool bulk.   Ease and regulate bowel movements.   Lower cholesterol.   REDUCE RISK OF COLON CANCER  INDICATIONS THAT YOU NEED MORE FIBER  Constipation and hemorrhoids.   Uncomplicated diverticulosis (intestine condition) and irritable  bowel syndrome.   Weight management.   As a protective measure against hardening of the arteries (atherosclerosis), diabetes, and cancer.   GUIDELINES FOR INCREASING FIBER IN THE DIET  Start adding fiber to the diet slowly. A gradual increase of about 5 more grams (2 slices of whole-wheat bread, 2 servings of most fruits or vegetables, or 1 bowl of high-fiber cereal) per day is best. Too rapid an increase in fiber may result in constipation, flatulence, and bloating.   Drink enough water and fluids to keep your urine clear or pale yellow. Water, juice, or caffeine-free drinks are recommended. Not drinking enough fluid may cause constipation.   Eat a variety of high-fiber foods rather than one type of fiber.   Try to increase your intake of fiber through using high-fiber foods rather than fiber pills or supplements that contain small amounts of fiber.   The goal is to change the types of food eaten. Do not supplement your present diet with high-fiber foods, but replace foods in your present diet.   INCLUDE A VARIETY OF FIBER SOURCES  Replace refined and processed grains with whole grains, canned fruits with fresh fruits, and incorporate other fiber sources. White rice, white breads, and most bakery goods contain little or no fiber.   Brown whole-grain rice, buckwheat oats, and many fruits and vegetables are all good sources of fiber. These include: broccoli, Brussels sprouts, cabbage, cauliflower, beets, sweet potatoes, white potatoes (skin on), carrots, tomatoes, eggplant, squash, berries, fresh fruits, and dried fruits.   Cereals appear to be the richest source of fiber. Cereal fiber is found in whole grains and bran. Bran is the fiber-rich outer coat of cereal grain, which is largely removed in refining. In whole-grain cereals, the bran remains. In breakfast cereals, the largest amount of fiber is found in those with "bran" in their names. The fiber content is sometimes indicated on the  label.   You may need to include additional fruits and vegetables each day.   In baking, for 1 cup white flour, you may use the following substitutions:   1 cup whole-wheat flour minus 2 tablespoons.   1/2 cup white flour plus 1/2 cup whole-wheat flour.   Polyps, Colon  A polyp is extra tissue that grows inside your body. Colon polyps grow in the large intestine. The large intestine, also called the colon, is part of your digestive system. It is a Kuehl, hollow tube at the end of your digestive tract where your body makes and stores stool. Most polyps are not dangerous. They are benign. This means they are not cancerous. But over time, some types of polyps can turn into cancer. Polyps that are smaller than a pea are usually not harmful. But larger polyps could someday become or may already be cancerous. To be safe, doctors remove all polyps and test them.   PREVENTION There is not one sure way to prevent polyps. You might be able to lower your risk of getting them if  you:  Eat more fruits and vegetables and less fatty food.   Do not smoke.   Avoid alcohol.   Exercise every day.   Lose weight if you are overweight.   Eating more calcium and folate can also lower your risk of getting polyps. Some foods that are rich in calcium are milk, cheese, and broccoli. Some foods that are rich in folate are chickpeas, kidney beans, and spinach.    Diverticulosis Diverticulosis is a common condition that develops when small pouches (diverticula) form in the wall of the colon. The risk of diverticulosis increases with age. It happens more often in people who eat a low-fiber diet. Most individuals with diverticulosis have no symptoms. Those individuals with symptoms usually experience belly (abdominal) pain, constipation, or loose stools (diarrhea).  HOME CARE INSTRUCTIONS  Increase the amount of fiber in your diet as directed by your caregiver or dietician. This may reduce symptoms of  diverticulosis.   Drink at least 6 to 8 glasses of water each day to prevent constipation.   Try not to strain when you have a bowel movement.   Avoiding nuts and seeds to prevent complications is NOT NECESSARY.   FOODS HAVING HIGH FIBER CONTENT INCLUDE:  Fruits. Apple, peach, pear, tangerine, raisins, prunes.   Vegetables. Brussels sprouts, asparagus, broccoli, cabbage, carrot, cauliflower, romaine lettuce, spinach, summer squash, tomato, winter squash, zucchini.   Starchy Vegetables. Baked beans, kidney beans, lima beans, split peas, lentils, potatoes (with skin).   Grains. Whole wheat bread, brown rice, bran flake cereal, plain oatmeal, white rice, shredded wheat, bran muffins.   SEEK IMMEDIATE MEDICAL CARE IF:  You develop increasing pain or severe bloating.   You have an oral temperature above 101F.   You develop vomiting or bowel movements that are bloody or black.   Hemorrhoids Hemorrhoids are dilated (enlarged) veins around the rectum. Sometimes clots will form in the veins. This makes them swollen and painful. These are called thrombosed hemorrhoids. Causes of hemorrhoids include:  Constipation.   Straining to have a bowel movement.   HEAVY LIFTING   HOME CARE INSTRUCTIONS  Eat a well balanced diet and drink 6 to 8 glasses of water every day to avoid constipation. You may also use a bulk laxative.   Avoid straining to have bowel movements.   Keep anal area dry and clean.   Do not use a donut shaped pillow or sit on the toilet for Godeaux periods. This increases blood pooling and pain.   Move your bowels when your body has the urge; this will require less straining and will decrease pain and pressure.

## 2018-02-04 NOTE — H&P (Signed)
Primary Care Physician:  Lemmie Evens, MD Primary Gastroenterologist:  Dr. Oneida Alar  Pre-Procedure History & Physical: HPI:  [name] [name] is a 72 y.o. female here for [place].  Past Medical History:  Diagnosis Date  . Anemia   . Diabetes mellitus   . Gout    right elbow  . Hypercholesteremia   . Hypertension   . Vertigo    history    Past Surgical History:  Procedure Laterality Date  . ABDOMINAL HYSTERECTOMY    . REPLACEMENT TOTAL KNEE     right    Prior to Admission medications   Medication Sig Start Date End Date Taking? Authorizing Provider  carvedilol (COREG) 25 MG tablet Take 25 mg by mouth 2 (two) times daily with a meal.     Yes [provider]  gemfibrozil (LOPID) 600 MG tablet Take 600 mg by mouth 2 (two) times daily before a meal.     Yes [provider]  glipiZIDE (GLUCOTROL XL) 10 MG 24 hr tablet Take 10 mg by mouth daily.   Yes [provider]  lisinopril (PRINIVIL,ZESTRIL) 20 MG tablet Take 20 mg by mouth daily.  12/31/17  Yes [provider]  metFORMIN (GLUCOPHAGE) 1000 MG tablet Take 1,000 mg by mouth daily. 09/23/14  Yes [provider]  polyethylene glycol-electrolytes (TRILYTE) 420 g solution Take 4,000 mLs by mouth as directed. 12/31/17  Yes Annitta Needs, NP  simvastatin (ZOCOR) 40 MG tablet Take 40 mg by mouth at bedtime. 07/14/13  Yes [provider]  Cholecalciferol (VITAMIN D3 PO) Take 1 capsule by mouth daily.    [provider]  ferrous sulfate 325 (65 FE) MG tablet Take 325 mg by mouth daily with breakfast.    [provider]    Allergies as of 12/31/2017 - Review Complete 12/31/2017  Allergen Reaction Noted  . Penicillins Hives     Family History  Problem Relation Age of Onset  . Colon cancer Sister     Social History   Socioeconomic History  . Marital status: Widowed    Spouse name: Not on file  . Number of children: Not on file  . Years of education:  Not on file  . Highest education level: Not on file  Occupational History  . Not on file  Social Needs  . Financial resource strain: Not on file  . Food insecurity:    Worry: Not on file    Inability: Not on file  . Transportation needs:    Medical: Not on file    Non-medical: Not on file  Tobacco Use  . Smoking status: Never Smoker  . Smokeless tobacco: Never Used  Substance and Sexual Activity  . Alcohol use: No  . Drug use: No  . Sexual activity: Not on file  Lifestyle  . Physical activity:    Days per week: Not on file    Minutes per session: Not on file  . Stress: Not on file  Relationships  . Social connections:    Talks on phone: Not on file    Gets together: Not on file    Attends religious service: Not on file    Active member of club or organization: Not on file    Attends meetings of clubs or organizations: Not on file    Relationship status: Not on file  . Intimate partner violence:    Fear of current or ex partner: Not on file    Emotionally abused: Not on file  Physically abused: Not on file    Forced sexual activity: Not on file  Other Topics Concern  . Not on file  Social History Narrative  . Not on file    Review of Systems: See HPI, otherwise negative ROS   Physical Exam: BP (!) 147/67   Pulse 66   Temp 98.3 F (36.8 C) (Oral)   Resp 18   Ht 5\' 7"  (1.702 m)   Wt 227 lb (103 kg)   SpO2 99%   BMI 35.55 kg/m  General:   Alert,  pleasant and cooperative in NAD Head:  Normocephalic and atraumatic. Neck:  Supple; Lungs:  Clear throughout to auscultation.    Heart:  Regular rate and rhythm. Abdomen:  Soft, nontender and nondistended. Normal bowel sounds, without guarding, and without rebound.   Neurologic:  Alert and  oriented x4;  grossly normal neurologically.  Impression/Plan:     SCREENING  Plan:  1. TCS TODAY DISCUSSED PROCEDURE, BENEFITS, & RISKS: < 1% chance of medication reaction, bleeding, perforation, or rupture of  spleen/liver.

## 2018-02-04 NOTE — Op Note (Signed)
[facility] Patient Name: [name] [name] Procedure Date: 02/04/2018 9:16 AM MRN: [id number] Date of Birth: July 15, 1946 Attending MD: Barney Drain MD, MD CSN: [id number] Age: 72 Admit Type: Outpatient Procedure:                Colonoscopy WITH COLD SNARE/SNARE CAUTERY                            POLYPECTOMY Indications:              Screening for colorectal malignant neoplasm Providers:                Barney Drain MD, MD, Rosina Lowenstein, RN, Nelma Rothman,                            Technician Referring MD:             Newt Minion, MD Medicines:                Meperidine 50 mg IV, Midazolam 4 mg IV Complications:            No immediate complications. Estimated Blood Loss:     Estimated blood loss was minimal. Procedure:                Pre-Anesthesia Assessment:                           - Prior to the procedure, a History and Physical                            was performed, and patient medications and                            allergies were reviewed. The patient's tolerance of                            previous anesthesia was also reviewed. The risks                            and benefits of the procedure and the sedation                            options and risks were discussed with the patient.                            All questions were answered, and informed consent                            was obtained. Prior Anticoagulants: The patient has                            taken aspirin, last dose was 1 day prior to                            procedure. ASA Grade Assessment: II - A patient  with mild systemic disease. After reviewing the                            risks and benefits, the patient was deemed in                            satisfactory condition to undergo the procedure.                            After obtaining informed consent, the colonoscope                            was passed under direct vision. Throughout the         procedure, the patient's blood pressure, pulse, and                            oxygen saturations were monitored continuously. The                            EC-[id number] ([id number]) scope was introduced through                            the anus and advanced to the the cecum, identified                            by appendiceal orifice and ileocecal valve. The                            colonoscopy was somewhat difficult due to a                            tortuous colon. Successful completion of the                            procedure was aided by straightening and shortening                            the scope to obtain bowel loop reduction and                            COLOWRAP. The patient tolerated the procedure well.                            The quality of the bowel preparation was excellent.                            The ileocecal valve, appendiceal orifice, and                            rectum were photographed. Scope In: 9:33:48 AM Scope Out: 9:51:47 AM Scope Withdrawal Time: 0 hours 16 minutes 36 seconds  Total Procedure Duration: 0 hours 17 minutes 59 seconds  Findings:      Three sessile polyps were found  in the descending colon and transverse       colon. The polyps were 3 to 5 mm in size. These polyps were removed with       a cold snare. Resection and retrieval were complete. Coagulation for       hemostasis of bleeding caused by the procedure using snare was       successful. Estimated blood loss was minimal.      A 6 mm polyp was found in the descending colon. The polyp was sessile.       The polyp was removed with a hot snare. Resection and retrieval were       complete.      Many small and large-mouthed diverticula were found in the recto-sigmoid       colon and sigmoid colon.      Internal hemorrhoids were found during retroflexion. The hemorrhoids       were small. Impression:               - Three 3 to 5 mm polyps in the descending colon                             and in the transverse colon, removed with a cold                            snare. Resected and retrieved. Treated with a hot                            snare.                           - One 6 mm polyp in the descending colon, removed                            with a hot snare. Resected and retrieved.                           - MILD TO MODERATE Diverticulosis in the                            recto-sigmoid colon and in the sigmoid colon.                           - Internal hemorrhoids. Moderate Sedation:      Moderate (conscious) sedation was administered by the endoscopy nurse       and supervised by the endoscopist. The following parameters were       monitored: oxygen saturation, heart rate, blood pressure, and response       to care. Total physician intraservice time was 28 minutes. Recommendation:           - Repeat colonoscopy in 3 - 5 years for                            surveillance.                           - High fiber diet.                           -  Continue present medications.                           - Await pathology results.                           - Patient has a contact number available for                            emergencies. The signs and symptoms of potential                            delayed complications were discussed with the                            patient. Return to normal activities tomorrow.                            Written discharge instructions were provided to the                            patient. Procedure Code(s):        --- Professional ---                           [phone], Colonoscopy, flexible; with removal of                            tumor(s), polyp(s), or other lesion(s) by snare                            technique                           99152, Moderate sedation services provided by the                            same physician or other qualified health care                            professional performing the  diagnostic or                            therapeutic service that the sedation supports,                            requiring the presence of an independent trained                            observer to assist in the monitoring of the                            patient's level of consciousness and physiological                            status; initial 15  minutes of intraservice time,                            patient age 31 years or older                           [phone], Moderate sedation services; each additional                            15 minutes intraservice time Diagnosis Code(s):        --- Professional ---                           Z12.11, Encounter for screening for malignant                            neoplasm of colon                           D12.4, Benign neoplasm of descending colon                           D12.3, Benign neoplasm of transverse colon (hepatic                            flexure or splenic flexure)                           K64.8, Other hemorrhoids                           K57.30, Diverticulosis of large intestine without                            perforation or abscess without bleeding CPT copyright 2016 [employer]. All rights reserved. The codes documented in this report are preliminary and upon coder review may  be revised to meet current compliance requirements. Barney Drain, MD Barney Drain MD, MD 02/04/2018 10:40:42 AM This report has been signed electronically. Number of Addenda: 0

## 2018-02-06 ENCOUNTER — Encounter (HOSPITAL_COMMUNITY): Payer: Self-pay

## 2018-02-06 ENCOUNTER — Ambulatory Visit (HOSPITAL_COMMUNITY)
Admission: RE | Admit: 2018-02-06 | Discharge: 2018-02-06 | Disposition: A | Discharge status: Home / Self Care | Payer: Medicare Other | Source: Ambulatory Visit | Attending: Family Medicine | Admitting: Family Medicine

## 2018-02-06 ENCOUNTER — Ambulatory Visit (HOSPITAL_COMMUNITY): Payer: Medicare Other

## 2018-02-06 DIAGNOSIS — Z1231 Encounter for screening mammogram for malignant neoplasm of breast: Secondary | ICD-10-CM | POA: Diagnosis present

## 2018-02-07 NOTE — Progress Notes (Signed)
PT is aware.

## 2019-01-15 ENCOUNTER — Other Ambulatory Visit (HOSPITAL_COMMUNITY): Payer: Self-pay | Admitting: Family Medicine

## 2019-01-15 DIAGNOSIS — Z1231 Encounter for screening mammogram for malignant neoplasm of breast: Secondary | ICD-10-CM

## 2019-02-10 ENCOUNTER — Ambulatory Visit (HOSPITAL_COMMUNITY): Payer: Medicare Other

## 2019-03-13 ENCOUNTER — Ambulatory Visit (HOSPITAL_COMMUNITY): Payer: Medicare Other

## 2019-03-28 ENCOUNTER — Ambulatory Visit (HOSPITAL_COMMUNITY)
Admission: RE | Admit: 2019-03-28 | Discharge: 2019-03-28 | Disposition: A | Discharge status: Home / Self Care | Payer: Medicare Other | Source: Ambulatory Visit | Attending: Family Medicine | Admitting: Family Medicine

## 2019-03-28 ENCOUNTER — Other Ambulatory Visit: Payer: Self-pay

## 2019-03-28 DIAGNOSIS — Z1231 Encounter for screening mammogram for malignant neoplasm of breast: Secondary | ICD-10-CM | POA: Insufficient documentation

## 2019-08-12 ENCOUNTER — Other Ambulatory Visit: Payer: Self-pay

## 2019-08-12 DIAGNOSIS — Z20822 Contact with and (suspected) exposure to covid-19: Secondary | ICD-10-CM

## 2019-08-12 NOTE — Addendum Note (Signed)
Addended by: Marvene Staff on: 08/12/2019 12:32 PM   Modules accepted: Orders

## 2019-09-02 ENCOUNTER — Other Ambulatory Visit: Payer: Self-pay | Admitting: *Deleted

## 2019-09-02 DIAGNOSIS — Z20822 Contact with and (suspected) exposure to covid-19: Secondary | ICD-10-CM

## 2019-09-03 LAB — NOVEL CORONAVIRUS, NAA: SARS-CoV-2, NAA: NOT DETECTED

## 2020-03-04 ENCOUNTER — Other Ambulatory Visit (HOSPITAL_COMMUNITY): Payer: Self-pay | Admitting: Family Medicine

## 2020-03-04 DIAGNOSIS — Z1231 Encounter for screening mammogram for malignant neoplasm of breast: Secondary | ICD-10-CM

## 2020-03-29 ENCOUNTER — Other Ambulatory Visit: Payer: Self-pay

## 2020-03-29 ENCOUNTER — Ambulatory Visit (HOSPITAL_COMMUNITY)
Admission: RE | Admit: 2020-03-29 | Discharge: 2020-03-29 | Disposition: A | Discharge status: Home / Self Care | Payer: Medicare Other | Source: Ambulatory Visit | Attending: Family Medicine | Admitting: Family Medicine

## 2020-03-29 DIAGNOSIS — Z1231 Encounter for screening mammogram for malignant neoplasm of breast: Secondary | ICD-10-CM | POA: Diagnosis present

## 2021-01-03 ENCOUNTER — Encounter: Payer: Self-pay | Admitting: Internal Medicine

## 2021-02-11 ENCOUNTER — Ambulatory Visit: Payer: Medicare Other | Admitting: Gastroenterology

## 2021-02-17 NOTE — Progress Notes (Signed)
Primary Care Physician:  Gareth Morgan, MD Primary Gastroenterologist:  Dr. Marletta Lor  Chief Complaint  Patient presents with  . Colonoscopy    Due for tcs. Doing ok    HPI:   [name] [name] is a 75 y.o. female presenting today to discuss scheduling surveillance colonoscopy.  Last colonoscopy in March 2019 with three 3-5 mm polyps and one 6 mm polyp, mild to moderate diverticulosis in the rectosigmoid and sigmoid colon, and internal hemorrhoids.  Pathology revealed tubular adenomas.  Recommended repeat colonoscopy in 3 years.  Today: Denies abdominal pain, constipation, diarrhea, blood in the stools, black stool, or unintentional weight loss. Denies GERD symptoms, nausea, vomiting. Rare dysphagia to large pills. Feels it goes down a little slower. No routine trouble with swallowing pills and no trouble with foods.    Past Medical History:  Diagnosis Date  . Anemia   . Diabetes mellitus   . Gout    right elbow  . Hypercholesteremia   . Hypertension   . Vertigo    history    Past Surgical History:  Procedure Laterality Date  . ABDOMINAL HYSTERECTOMY    . BREAST BIOPSY Left   . COLONOSCOPY N/A 02/04/2018   Surgeon: West Bali, MD;  three 3-5 mm polyps and one 6 mm polyp, mild to moderate diverticulosis in the rectosigmoid and sigmoid colon, and internal hemorrhoids.  Pathology revealed tubular adenomas.  Recommended repeat colonoscopy in 3 years.  . REPLACEMENT TOTAL KNEE     right    Current Outpatient Medications  Medication Sig Dispense Refill  . carvedilol (COREG) 25 MG tablet Take 25 mg by mouth 2 (two) times daily with a meal.    . Cholecalciferol (VITAMIN D3 PO) Take 1 capsule by mouth daily.    . ferrous sulfate 325 (65 FE) MG tablet Take 325 mg by mouth daily with breakfast.    . gemfibrozil (LOPID) 600 MG tablet Take 600 mg by mouth 2 (two) times daily before a meal.    . glipiZIDE (GLUCOTROL XL) 10 MG 24 hr tablet Take 10 mg by mouth daily.    Marland Kitchen lisinopril  (PRINIVIL,ZESTRIL) 20 MG tablet Take 20 mg by mouth daily.     . metFORMIN (GLUCOPHAGE) 1000 MG tablet Take 1,000 mg by mouth daily.    . simvastatin (ZOCOR) 40 MG tablet Take 40 mg by mouth at bedtime.    Marland Kitchen CLENPIQ 10-3.5-12 MG-GM -GM/160ML SOLN Take 1 kit by mouth once for 1 dose. 320 mL 0   No current facility-administered medications for this visit.    Allergies as of 02/18/2021 - Review Complete 02/18/2021  Allergen Reaction Noted  . Penicillins Hives and Other (See Comments)     Family History  Problem Relation Age of Onset  . Colon cancer Sister        71s    Social History   Socioeconomic History  . Marital status: Widowed    Spouse name: Not on file  . Number of children: Not on file  . Years of education: Not on file  . Highest education level: Not on file  Occupational History  . Not on file  Tobacco Use  . Smoking status: Never Smoker  . Smokeless tobacco: Never Used  Vaping Use  . Vaping Use: Never used  Substance and Sexual Activity  . Alcohol use: No  . Drug use: No  . Sexual activity: Not on file  Other Topics Concern  . Not on file  Social History Narrative  .  Not on file   Social Determinants of Health   Financial Resource Strain: Not on file  Food Insecurity: Not on file  Transportation Needs: Not on file  Physical Activity: Not on file  Stress: Not on file  Social Connections: Not on file  Intimate Partner Violence: Not on file    Review of Systems: Gen: Denies any fever, chills, cold or flulike symptoms, lightheadedness, dizziness, presyncope, syncope. CV: Denies chest pain or palpitations. Resp: Denies shortness of breath or cough. GI: See HPI Heme: See HPI  Physical Exam: BP (!) 163/88   Pulse 72   Temp (!) 96.6 F (35.9 C) (Temporal)   Ht 5' 7" (1.702 m)   Wt 228 lb 6.4 oz (103.6 kg)   BMI 35.77 kg/m  General:   Alert and oriented. Pleasant and cooperative. Well-nourished and well-developed.  Head:  Normocephalic and  atraumatic. Eyes:  Without icterus, sclera clear and conjunctiva pink.  Ears:  Normal auditory acuity. Lungs:  Clear to auscultation bilaterally. No wheezes, rales, or rhonchi. No distress.  Heart:  S1, S2 present without murmurs appreciated.  Abdomen:  +BS, soft, non-tender and non-distended. No HSM noted. No guarding or rebound. No masses appreciated.  Rectal:  Deferred  Msk:  Symmetrical without gross deformities. Normal posture. Extremities:  Without edema. Neurologic:  Alert and  oriented x4;  grossly normal neurologically. Skin:  Intact without significant lesions or rashes. Psych:  Normal mood and affect.   Assessment: 75 year old female with history of diabetes, HTN, HLD, and adenomatous colon polyps presenting today to schedule surveillance colonoscopy.  Last colonoscopy March 2019 with three 3-5 mm polyps one 6 mm polyp, mild to moderate diverticulosis in the rectosigmoid and sigmoid colon, and internal hemorrhoids.  Pathology revealed tubular adenomas.  Recommended repeat colonoscopy in 3 years.  She has no significant upper or lower GI symptoms.  No alarm symptoms.  Family history significant for sister with colon cancer in her 38s.   Plan: 1.  Proceed with colonoscopy with propofol with Dr. Abbey Chatters in the near future. The risks, benefits, and alternatives have been discussed with the patient in detail. The patient states understanding and desires to proceed.  ASA II Hold iron x 7 days prior to procedure.  See separate instructions for diabetes medication adjustments.  2.  Follow-up as needed.    [name], PA-C [facility] 02/18/2021

## 2021-02-18 ENCOUNTER — Other Ambulatory Visit: Payer: Self-pay

## 2021-02-18 ENCOUNTER — Ambulatory Visit: Payer: Medicare Other | Admitting: Gastroenterology

## 2021-02-18 ENCOUNTER — Encounter: Payer: Self-pay | Admitting: Gastroenterology

## 2021-02-18 VITALS — BP 163/88 | HR 72 | Temp 96.6°F | Ht 67.0 in | Wt 228.4 lb

## 2021-02-18 DIAGNOSIS — Z86010 Personal history of colonic polyps: Secondary | ICD-10-CM

## 2021-02-18 DIAGNOSIS — Z01818 Encounter for other preprocedural examination: Secondary | ICD-10-CM | POA: Diagnosis not present

## 2021-02-18 MED ORDER — CLENPIQ 10-3.5-12 MG-GM -GM/160ML PO SOLN: 1.0000 | Freq: Once | ORAL | 0 refills | Status: AC

## 2021-02-18 NOTE — Patient Instructions (Signed)
We will arrange for you to have a colonoscopy in the near future with Dr. Abbey Chatters. Hold iron for 7 days prior to your procedure.  1 day prior to procedure: Continue glipizide 10 mg daily Take one half dose of Metformin (500 mg). Monitor blood sugars closely and correct any low blood sugars with approved sugary clear liquids.  Date of procedure: Do not take any morning diabetes medications.  We will plan to see you back as needed.  Do not hesitate to call if you have any new GI concerns.   It was great to meet you today!  [name], PA-C [facility]

## 2021-03-01 ENCOUNTER — Other Ambulatory Visit (HOSPITAL_COMMUNITY): Payer: Self-pay | Admitting: Family Medicine

## 2021-03-01 DIAGNOSIS — Z1231 Encounter for screening mammogram for malignant neoplasm of breast: Secondary | ICD-10-CM

## 2021-03-22 ENCOUNTER — Telehealth: Payer: Self-pay | Admitting: *Deleted

## 2021-03-22 NOTE — Telephone Encounter (Signed)
Patient called in. Wants to cancel TCS at this time. Had something to come up. Will call back to r/s. Endo aware.

## 2021-03-28 ENCOUNTER — Telehealth: Payer: Self-pay | Admitting: *Deleted

## 2021-03-28 NOTE — Telephone Encounter (Signed)
Patient called in to r/s her procedure. She has been scheduled for 6/20 in the am. Aware will mail new prep instructions with new covid test appt. Confirmed address. She already has her prep at home.

## 2021-04-01 ENCOUNTER — Ambulatory Visit (HOSPITAL_COMMUNITY)
Admission: RE | Admit: 2021-04-01 | Discharge: 2021-04-01 | Disposition: A | Discharge status: Home / Self Care | Payer: Medicare Other | Source: Ambulatory Visit | Attending: Family Medicine | Admitting: Family Medicine

## 2021-04-01 DIAGNOSIS — Z1231 Encounter for screening mammogram for malignant neoplasm of breast: Secondary | ICD-10-CM | POA: Insufficient documentation

## 2021-04-06 ENCOUNTER — Other Ambulatory Visit (HOSPITAL_COMMUNITY): Payer: Medicare Other

## 2021-04-08 ENCOUNTER — Ambulatory Visit (HOSPITAL_COMMUNITY): Admit: 2021-04-08 | Payer: Medicare Other

## 2021-04-08 ENCOUNTER — Encounter (HOSPITAL_COMMUNITY): Payer: Self-pay

## 2021-04-08 SURGERY — COLONOSCOPY WITH PROPOFOL
Anesthesia: Monitor Anesthesia Care

## 2021-04-25 ENCOUNTER — Telehealth: Payer: Self-pay

## 2021-04-25 NOTE — Telephone Encounter (Signed)
Called and informed pt covid test for 04/29/21 has been cancelled. COVID test no longer required. She is type 2 diabetic.

## 2021-04-27 ENCOUNTER — Telehealth: Payer: Self-pay | Admitting: *Deleted

## 2021-04-27 NOTE — Telephone Encounter (Signed)
PA approved via [facility] for TCS. Auth# [id number], DOS May 02, 2021 - Jul 31, 2021

## 2021-04-29 ENCOUNTER — Inpatient Hospital Stay (HOSPITAL_COMMUNITY): Admission: RE | Admit: 2021-04-29 | Payer: Medicare Other | Source: Ambulatory Visit

## 2021-05-02 ENCOUNTER — Encounter (HOSPITAL_COMMUNITY)
Admission: RE | Disposition: A | Discharge status: Home / Self Care | Payer: Self-pay | Source: Home / Self Care | Attending: Internal Medicine

## 2021-05-02 ENCOUNTER — Ambulatory Visit (HOSPITAL_COMMUNITY): Payer: Medicare Other | Admitting: Anesthesiology

## 2021-05-02 ENCOUNTER — Encounter (HOSPITAL_COMMUNITY): Payer: Self-pay

## 2021-05-02 ENCOUNTER — Ambulatory Visit (HOSPITAL_COMMUNITY)
Admission: RE | Admit: 2021-05-02 | Discharge: 2021-05-02 | Disposition: A | Discharge status: Home / Self Care | Payer: Medicare Other | Attending: Internal Medicine | Admitting: Internal Medicine

## 2021-05-02 ENCOUNTER — Other Ambulatory Visit: Payer: Self-pay

## 2021-05-02 DIAGNOSIS — Z880 Allergy status to penicillin: Secondary | ICD-10-CM | POA: Insufficient documentation

## 2021-05-02 DIAGNOSIS — D123 Benign neoplasm of transverse colon: Secondary | ICD-10-CM | POA: Diagnosis not present

## 2021-05-02 DIAGNOSIS — Z09 Encounter for follow-up examination after completed treatment for conditions other than malignant neoplasm: Secondary | ICD-10-CM | POA: Insufficient documentation

## 2021-05-02 DIAGNOSIS — K5730 Diverticulosis of large intestine without perforation or abscess without bleeding: Secondary | ICD-10-CM | POA: Diagnosis not present

## 2021-05-02 DIAGNOSIS — Z96651 Presence of right artificial knee joint: Secondary | ICD-10-CM | POA: Diagnosis not present

## 2021-05-02 DIAGNOSIS — D122 Benign neoplasm of ascending colon: Secondary | ICD-10-CM | POA: Diagnosis not present

## 2021-05-02 DIAGNOSIS — Z86010 Personal history of colonic polyps: Secondary | ICD-10-CM

## 2021-05-02 DIAGNOSIS — Z7984 Long term (current) use of oral hypoglycemic drugs: Secondary | ICD-10-CM | POA: Diagnosis not present

## 2021-05-02 DIAGNOSIS — K648 Other hemorrhoids: Secondary | ICD-10-CM | POA: Insufficient documentation

## 2021-05-02 DIAGNOSIS — Z79899 Other long term (current) drug therapy: Secondary | ICD-10-CM | POA: Insufficient documentation

## 2021-05-02 DIAGNOSIS — K635 Polyp of colon: Secondary | ICD-10-CM

## 2021-05-02 HISTORY — PX: COLONOSCOPY WITH PROPOFOL: SHX5780

## 2021-05-02 HISTORY — PX: POLYPECTOMY: SHX5525

## 2021-05-02 LAB — GLUCOSE, CAPILLARY: Glucose-Capillary: 109 mg/dL — ABNORMAL HIGH (ref 70–99)

## 2021-05-02 SURGERY — COLONOSCOPY WITH PROPOFOL
Anesthesia: General

## 2021-05-02 MED ORDER — LACTATED RINGERS IV SOLN: INTRAVENOUS | Status: DC

## 2021-05-02 MED ORDER — STERILE WATER FOR IRRIGATION IR SOLN
Status: DC | PRN
  Administered 2021-05-02: 1.5 mL

## 2021-05-02 MED ORDER — PROPOFOL 10 MG/ML IV BOLUS
INTRAVENOUS | Status: DC | PRN
  Administered 2021-05-02: 125 ug/kg/min via INTRAVENOUS
  Administered 2021-05-02: 100 mg via INTRAVENOUS

## 2021-05-02 NOTE — Anesthesia Postprocedure Evaluation (Signed)
Anesthesia Post Note  Patient: [name] [name]  Procedure(s) Performed: COLONOSCOPY WITH PROPOFOL POLYPECTOMY  Patient location during evaluation: Endoscopy Anesthesia Type: General Level of consciousness: awake and alert and oriented Pain management: pain level controlled Vital Signs Assessment: post-procedure vital signs reviewed and stable Respiratory status: spontaneous breathing and respiratory function stable Cardiovascular status: blood pressure returned to baseline and stable Postop Assessment: no apparent nausea or vomiting Anesthetic complications: no   No notable events documented.   Last Vitals:  Vitals:   05/02/21 0717 05/02/21 0822  BP: (!) 174/88 (!) 111/50  Pulse:  68  Resp: 20 12  Temp: 36.4 C (!) 36.3 C  SpO2: 98% 96%    Last Pain:  Vitals:   05/02/21 0822  TempSrc: Oral  PainSc: 0-No pain                 Najee Manninen C Sheyanne Munley

## 2021-05-02 NOTE — Discharge Instructions (Addendum)
  Colonoscopy Discharge Instructions  Read the instructions outlined below and refer to this sheet in the next few weeks. These discharge instructions provide you with general information on caring for yourself after you leave the hospital. Your doctor may also give you specific instructions. While your treatment has been planned according to the most current medical practices available, unavoidable complications occasionally occur.   ACTIVITY You may resume your regular activity, but move at a slower pace for the next 24 hours.  Take frequent rest periods for the next 24 hours.  Walking will help get rid of the air and reduce the bloated feeling in your belly (abdomen).  No driving for 24 hours (because of the medicine (anesthesia) used during the test).   Do not sign any important legal documents or operate any machinery for 24 hours (because of the anesthesia used during the test).  NUTRITION Drink plenty of fluids.  You may resume your normal diet as instructed by your doctor.  Begin with a light meal and progress to your normal diet. Heavy or fried foods are harder to digest and may make you feel sick to your stomach (nauseated).  Avoid alcoholic beverages for 24 hours or as instructed.  MEDICATIONS You may resume your normal medications unless your doctor tells you otherwise.  WHAT YOU CAN EXPECT TODAY Some feelings of bloating in the abdomen.  Passage of more gas than usual.  Spotting of blood in your stool or on the toilet paper.  IF YOU HAD POLYPS REMOVED DURING THE COLONOSCOPY: No aspirin products for 7 days or as instructed.  No alcohol for 7 days or as instructed.  Eat a soft diet for the next 24 hours.  FINDING OUT THE RESULTS OF YOUR TEST Not all test results are available during your visit. If your test results are not back during the visit, make an appointment with your caregiver to find out the results. Do not assume everything is normal if you have not heard from your  caregiver or the medical facility. It is important for you to follow up on all of your test results.  SEEK IMMEDIATE MEDICAL ATTENTION IF: You have more than a spotting of blood in your stool.  Your belly is swollen (abdominal distention).  You are nauseated or vomiting.  You have a temperature over 101.  You have abdominal pain or discomfort that is severe or gets worse throughout the day.   Your colonoscopy revealed 3 polyp(s) which I removed successfully. Await pathology results, my office will contact you. I recommend repeating colonoscopy in 5 years for surveillance purposes.   You also have diverticulosis and internal hemorrhoids. I would recommend increasing fiber in your diet or adding OTC Benefiber/Metamucil. Be sure to drink at least 4 to 6 glasses of water daily. Follow-up with GI as needed.   I hope you have a great rest of your week!  Charles K. Carver, D.O. Gastroenterology and Hepatology [facility]  

## 2021-05-02 NOTE — Op Note (Signed)
[facility] Patient Name: [name] [name] Procedure Date: 05/02/2021 7:47 AM MRN: [id number] Date of Birth: Jun 21, 1946 Attending MD: Elon Alas. Abbey Chatters DO CSN: [id number] Age: 75 Admit Type: Outpatient Procedure:                Colonoscopy Indications:              Surveillance: Personal history of adenomatous                            polyps on last colonoscopy 3 years ago Providers:                Elon Alas. Abbey Chatters, DO, Charlsie Quest. Theda Sers RN, RN,                            Nelma Rothman, Technician Referring MD:              Medicines:                See the Anesthesia note for documentation of the                            administered medications Complications:            No immediate complications. Estimated Blood Loss:     Estimated blood loss was minimal. Procedure:                Pre-Anesthesia Assessment:                           - The anesthesia plan was to use monitored                            anesthesia care (MAC).                           After obtaining informed consent, the colonoscope                            was passed under direct vision. Throughout the                            procedure, the patient's blood pressure, pulse, and                            oxygen saturations were monitored continuously. The                            [id number] ([id number]) scope was introduced through                            the anus and advanced to the the terminal ileum,                            with identification of the appendiceal orifice and                            IC valve. The  colonoscopy was performed without                            difficulty. The patient tolerated the procedure                            well. The quality of the bowel preparation was                            evaluated using the BBPS [facility] Bowel Preparation                            Scale) with scores of: Right Colon = 3, Transverse                            Colon = 3 and Left Colon = 3  (entire mucosa seen                            well with no residual staining, small fragments of                            stool or opaque liquid). The total BBPS score                            equals 9. Scope In: 8:05:00 AM Scope Out: 8:18:51 AM Scope Withdrawal Time: 0 hours 11 minutes 2 seconds  Total Procedure Duration: 0 hours 13 minutes 51 seconds  Findings:      The perianal and digital rectal examinations were normal.      Non-bleeding internal hemorrhoids were found during endoscopy.      Multiple small and large-mouthed diverticula were found in the sigmoid       colon and descending colon.      Two sessile polyps were found in the transverse colon and ascending       colon. The polyps were 2 mm in size. These polyps were removed with a       cold biopsy forceps. Resection and retrieval were complete.      A 5 mm polyp was found in the transverse colon. The polyp was sessile.       The polyp was removed with a cold snare. Resection and retrieval were       complete.      The terminal ileum appeared normal. Impression:               - Non-bleeding internal hemorrhoids.                           - Diverticulosis in the sigmoid colon and in the                            descending colon.                           - Two 2 mm polyps in the transverse colon and in  the ascending colon, removed with a cold biopsy                            forceps. Resected and retrieved.                           - One 5 mm polyp in the transverse colon, removed                            with a cold snare. Resected and retrieved.                           - The examined portion of the ileum was normal. Moderate Sedation:      Per Anesthesia Care Recommendation:           - Patient has a contact number available for                            emergencies. The signs and symptoms of potential                            delayed complications were discussed with the                             patient. Return to normal activities tomorrow.                            Written discharge instructions were provided to the                            patient.                           - Resume previous diet.                           - Continue present medications.                           - Await pathology results.                           - Repeat colonoscopy in 5 years for surveillance.                           - Return to GI clinic PRN. Procedure Code(s):        --- Professional ---                           [id number], Colonoscopy, flexible; with removal of                            tumor(s), polyp(s), or other lesion(s) by snare                            technique  03833, 33, Colonoscopy, flexible; with biopsy,                            single or multiple Diagnosis Code(s):        --- Professional ---                           K63.5, Polyp of colon                           Z86.010, Personal history of colonic polyps                           K64.8, Other hemorrhoids                           K57.30, Diverticulosis of large intestine without                            perforation or abscess without bleeding CPT copyright 2019 American Medical Association. All rights reserved. The codes documented in this report are preliminary and upon coder review may  be revised to meet current compliance requirements. Elon Alas. Abbey Chatters, DO [place] Abbey Chatters, DO 05/02/2021 8:22:41 AM This report has been signed electronically. Number of Addenda: 0

## 2021-05-02 NOTE — Transfer of Care (Signed)
Immediate Anesthesia Transfer of Care Note  Patient: [name] [name]  Procedure(s) Performed: COLONOSCOPY WITH PROPOFOL POLYPECTOMY  Patient Location: Endoscopy Unit  Anesthesia Type:General  Level of Consciousness: awake, alert , oriented and patient cooperative  Airway & Oxygen Therapy: Patient Spontanous Breathing  Post-op Assessment: Report given to RN, Post -op Vital signs reviewed and stable and Patient moving all extremities  Post vital signs: Reviewed and stable  Last Vitals:  Vitals Value Taken Time  BP    Temp    Pulse    Resp    SpO2      Last Pain:  Vitals:   05/02/21 0749  TempSrc:   PainSc: 0-No pain      Patients Stated Pain Goal: 9 (68/61/68 3729)  Complications: No notable events documented.

## 2021-05-02 NOTE — H&P (Signed)
Primary Care Physician:  Lemmie Evens, MD Primary Gastroenterologist:  Dr. Abbey Chatters  Pre-Procedure History & Physical: HPI:  [name] [name] is a 75 y.o. female is here for a colonoscopy for surveillance due to personal history of colon polyps. Last colonoscopy in March 2019 with three 3-5 mm polyps and one 6 mm polyp, mild to moderate diverticulosis in the rectosigmoid and sigmoid colon, and internal hemorrhoids.  Pathology revealed tubular adenomas.  Recommended repeat colonoscopy in 3 years  Past Medical History:  Diagnosis Date   Anemia    Diabetes mellitus    Gout    right elbow   Hypercholesteremia    Hypertension    Vertigo    history    Past Surgical History:  Procedure Laterality Date   ABDOMINAL HYSTERECTOMY     BREAST BIOPSY Left    COLONOSCOPY N/A 02/04/2018   Surgeon: Danie Binder, MD;  three 3-5 mm polyps and one 6 mm polyp, mild to moderate diverticulosis in the rectosigmoid and sigmoid colon, and internal hemorrhoids.  Pathology revealed tubular adenomas.  Recommended repeat colonoscopy in 3 years.   REPLACEMENT TOTAL KNEE     right    Prior to Admission medications   Medication Sig Start Date End Date Taking? Authorizing Provider  carvedilol (COREG) 25 MG tablet Take 25 mg by mouth 2 (two) times daily with a meal.   Yes [provider]  Cholecalciferol (VITAMIN D3) 25 MCG (1000 UT) CAPS Take 1,000 Units by mouth daily.   Yes [provider]  ferrous sulfate 325 (65 FE) MG tablet Take 325 mg by mouth daily with breakfast.   Yes [provider]  gemfibrozil (LOPID) 600 MG tablet Take 600 mg by mouth 2 (two) times daily before a meal.   Yes [provider]  glipiZIDE (GLUCOTROL XL) 10 MG 24 hr tablet Take 10 mg by mouth daily.   Yes [provider]  lisinopril (PRINIVIL,ZESTRIL) 20 MG tablet Take 20 mg by mouth daily.  12/31/17  Yes [provider]  metFORMIN (GLUCOPHAGE) 1000 MG tablet Take 1,000 mg by mouth  daily. 09/23/14  Yes [provider]  simvastatin (ZOCOR) 40 MG tablet Take 40 mg by mouth at bedtime. 07/14/13  Yes [provider]    Allergies as of 03/28/2021 - Review Complete 02/18/2021  Allergen Reaction Noted   Penicillins Hives and Other (See Comments)     Family History  Problem Relation Age of Onset   Colon cancer Sister        83s    Social History   Socioeconomic History   Marital status: Widowed    Spouse name: Not on file   Number of children: Not on file   Years of education: Not on file   Highest education level: Not on file  Occupational History   Not on file  Tobacco Use   Smoking status: Never   Smokeless tobacco: Never  Vaping Use   Vaping Use: Never used  Substance and Sexual Activity   Alcohol use: No   Drug use: No   Sexual activity: Not on file  Other Topics Concern   Not on file  Social History Narrative   Not on file   Social Determinants of Health   Financial Resource Strain: Not on file  Food Insecurity: Not on file  Transportation Needs: Not on file  Physical Activity: Not on file  Stress: Not on file  Social Connections: Not on file  Intimate Partner Violence: Not on file  Review of Systems: See HPI, otherwise negative ROS  Physical Exam: Vital signs in last 24 hours: Temp:  [97.6 F (36.4 C)] 97.6 F (36.4 C) (06/20 0717) Resp:  [20] 20 (06/20 0717) BP: (174)/(88) 174/88 (06/20 0717) SpO2:  [98 %] 98 % (06/20 0717) Weight:  [103.4 kg] 103.4 kg (06/20 0717)   General:   Alert,  Well-developed, well-nourished, pleasant and cooperative in NAD Head:  Normocephalic and atraumatic. Eyes:  Sclera clear, no icterus.   Conjunctiva pink. Ears:  Normal auditory acuity. Nose:  No deformity, discharge,  or lesions. Mouth:  No deformity or lesions, dentition normal. Neck:  Supple; no masses or thyromegaly. Lungs:  Clear throughout to auscultation.   No wheezes, crackles, or rhonchi. No acute distress. Heart:   Regular rate and rhythm; no murmurs, clicks, rubs,  or gallops. Abdomen:  Soft, nontender and nondistended. No masses, hepatosplenomegaly or hernias noted. Normal bowel sounds, without guarding, and without rebound.   Msk:  Symmetrical without gross deformities. Normal posture. Extremities:  Without clubbing or edema. Neurologic:  Alert and  oriented x4;  grossly normal neurologically. Skin:  Intact without significant lesions or rashes. Cervical Nodes:  No significant cervical adenopathy. Psych:  Alert and cooperative. Normal mood and affect.  Impression/Plan: [name] [name] is here for a colonoscopy for surveillance due to personal history of colon polyps. Last colonoscopy in March 2019 with three 3-5 mm polyps and one 6 mm polyp, mild to moderate diverticulosis in the rectosigmoid and sigmoid colon, and internal hemorrhoids.  Pathology revealed tubular adenomas.  Recommended repeat colonoscopy in 3 years  The risks of the procedure including infection, bleed, or perforation as well as benefits, limitations, alternatives and imponderables have been reviewed with the patient. Questions have been answered. All parties agreeable.

## 2021-05-02 NOTE — Anesthesia Preprocedure Evaluation (Addendum)
Anesthesia Evaluation  Patient identified by MRN, date of birth, ID band Patient awake    Reviewed: Allergy & Precautions, NPO status , Patient's Chart, lab work & pertinent test results, reviewed documented beta blocker date and time   History of Anesthesia Complications Negative for: history of anesthetic complications  Airway Mallampati: II  TM Distance: >3 FB Neck ROM: Full    Dental  (+) Dental Advisory Given, Partial Lower   Pulmonary neg pulmonary ROS,    Pulmonary exam normal breath sounds clear to auscultation       Cardiovascular Exercise Tolerance: Good hypertension, Pt. on medications and Pt. on home beta blockers Normal cardiovascular exam Rhythm:Regular Rate:Normal     Neuro/Psych negative neurological ROS  negative psych ROS   GI/Hepatic Neg liver ROS, GERD  Controlled,  Endo/Other  diabetes, Well Controlled, Type 2, Oral Hypoglycemic Agents  Renal/GU negative Renal ROS     Musculoskeletal  (+) Arthritis ,   Abdominal   Peds  Hematology  (+) anemia ,   Anesthesia Other Findings   Reproductive/Obstetrics                            Anesthesia Physical Anesthesia Plan  ASA: 2  Anesthesia Plan: General   Post-op Pain Management:    Induction:   PONV Risk Score and Plan: Propofol infusion  Airway Management Planned: Nasal Cannula and Natural Airway  Additional Equipment:   Intra-op Plan:   Post-operative Plan:   Informed Consent: I have reviewed the patients History and Physical, chart, labs and discussed the procedure including the risks, benefits and alternatives for the proposed anesthesia with the patient or authorized representative who has indicated his/her understanding and acceptance.     Dental advisory given  Plan Discussed with: CRNA and Surgeon  Anesthesia Plan Comments:         Anesthesia Quick Evaluation

## 2021-05-03 LAB — SURGICAL PATHOLOGY

## 2021-05-05 ENCOUNTER — Encounter: Payer: Self-pay | Admitting: *Deleted

## 2021-05-09 ENCOUNTER — Encounter (HOSPITAL_COMMUNITY): Payer: Self-pay | Admitting: Internal Medicine

## 2022-02-06 ENCOUNTER — Ambulatory Visit: Payer: Medicare Other

## 2022-02-06 ENCOUNTER — Ambulatory Visit: Payer: Medicare Other | Admitting: Orthopedic Surgery

## 2022-02-06 ENCOUNTER — Encounter: Payer: Self-pay | Admitting: Orthopedic Surgery

## 2022-02-06 ENCOUNTER — Other Ambulatory Visit: Payer: Self-pay

## 2022-02-06 VITALS — BP 179/91 | HR 66 | Ht 67.0 in | Wt 228.4 lb

## 2022-02-06 DIAGNOSIS — Z96651 Presence of right artificial knee joint: Secondary | ICD-10-CM

## 2022-02-06 DIAGNOSIS — M25562 Pain in left knee: Secondary | ICD-10-CM | POA: Diagnosis not present

## 2022-02-06 NOTE — Progress Notes (Signed)
EVALUATION AND MANAGEMENT  ? ?Type of appointment : new ? ?PLAN: Patient will take Tylenol for pain as it is helping her left knee arthritis ? ? ? ?No orders of the defined types were placed in this encounter. ? ? ? ?Chief Complaint  ?Patient presents with  ? Knee Pain  ?  RT knee/ hx of TKR-just time for a check up for that knee ?States left knee is also hurting sometimes  ? ? ?HPI 76 year old female presents with request to check her right knee has been 12 years since her right total knee she has a fixed-bearing posterior stabilized Sigma total knee from [place] ? ?She has no problems with that knee ? ?She is complaining some mild discomfort in her left knee and has had that for some time most of the pain is on the medial side ? ?ROS ?No back pain numbness tingling fever chills shortness of breath or chest pain ? ?Body mass index is 35.77 kg/m?. ? ?Physical Exam ?Constitutional:   ?   General: She is not in acute distress. ?   Appearance: She is well-developed.  ?   Comments: Well developed, well nourished ?Normal grooming and hygiene  ? ?  ?Cardiovascular:  ?   Comments: No peripheral edema ?Musculoskeletal:  ?   Comments: The right knee is stable in extension good strength no swelling no tenderness incision has healed she has full extension and 120 degrees of flexion ? ?The left knee has some mild medial joint line tenderness full extension and only 115 degrees of flexion.  No effusion is noted muscle tone is normal  ?Skin: ?   General: Skin is warm and dry.  ?Neurological:  ?   Mental Status: She is alert and oriented to person, place, and time.  ?   Sensory: No sensory deficit.  ?   Coordination: Coordination normal.  ?   Gait: Gait normal.  ?   Deep Tendon Reflexes: Reflexes are normal and symmetric.  ?Psychiatric:     ?   Mood and Affect: Mood normal.     ?   Behavior: Behavior normal.     ?   Thought Content: Thought content normal.     ?   Judgment: Judgment normal.  ?   Comments: Affect normal   ? ? ?Past  Medical History:  ?Diagnosis Date  ? Anemia   ? Diabetes mellitus   ? Gout   ? right elbow  ? Hypercholesteremia   ? Hypertension   ? Vertigo   ? history  ? ?Past Surgical History:  ?Procedure Laterality Date  ? ABDOMINAL HYSTERECTOMY    ? BREAST BIOPSY Left   ? COLONOSCOPY N/A 02/04/2018  ? Surgeon: Danie Binder, MD;  three 3-5 mm polyps and one 6 mm polyp, mild to moderate diverticulosis in the rectosigmoid and sigmoid colon, and internal hemorrhoids.  Pathology revealed tubular adenomas.  Recommended repeat colonoscopy in 3 years.  ? COLONOSCOPY WITH PROPOFOL N/A 05/02/2021  ? Procedure: COLONOSCOPY WITH PROPOFOL;  Surgeon: Eloise Harman, DO;  Location: [facility];  Service: Endoscopy;  Laterality: N/A;  8:30am  ? POLYPECTOMY  05/02/2021  ? Procedure: POLYPECTOMY;  Surgeon: Eloise Harman, DO;  Location: [facility];  Service: Endoscopy;;  ? REPLACEMENT TOTAL KNEE    ? right  ? ?Social History  ? ?Tobacco Use  ? Smoking status: Never  ? Smokeless tobacco: Never  ?Vaping Use  ? Vaping Use: Never used  ?Substance Use Topics  ?  Alcohol use: No  ? Drug use: No  ? ? ? ?Assessment and Plan: ? ?Encounter Diagnoses  ?Name Primary?  ? Left knee pain, unspecified chronicity Yes  ? History of total right knee replacement (TKR)   ? ? ?Recommend Tylenol for pain at this point it seems to be working fine she does not need knee replacement at this time ?

## 2022-02-18 ENCOUNTER — Encounter (HOSPITAL_COMMUNITY): Payer: Self-pay | Admitting: Emergency Medicine

## 2022-02-18 ENCOUNTER — Emergency Department (HOSPITAL_COMMUNITY)
Admission: EM | Admit: 2022-02-18 | Discharge: 2022-02-18 | Disposition: A | Discharge status: Home / Self Care | Payer: Medicare Other | Attending: Emergency Medicine | Admitting: Emergency Medicine

## 2022-02-18 ENCOUNTER — Emergency Department (HOSPITAL_COMMUNITY): Payer: Medicare Other

## 2022-02-18 ENCOUNTER — Other Ambulatory Visit: Payer: Self-pay

## 2022-02-18 DIAGNOSIS — Z79899 Other long term (current) drug therapy: Secondary | ICD-10-CM | POA: Insufficient documentation

## 2022-02-18 DIAGNOSIS — G8929 Other chronic pain: Secondary | ICD-10-CM

## 2022-02-18 DIAGNOSIS — M25512 Pain in left shoulder: Secondary | ICD-10-CM | POA: Insufficient documentation

## 2022-02-18 DIAGNOSIS — Z7984 Long term (current) use of oral hypoglycemic drugs: Secondary | ICD-10-CM | POA: Diagnosis not present

## 2022-02-18 DIAGNOSIS — M546 Pain in thoracic spine: Secondary | ICD-10-CM | POA: Diagnosis not present

## 2022-02-18 NOTE — ED Triage Notes (Signed)
Pt to the ED with complaints of left shoulder and upper back pain that radiates down her left arm. ? ?Pt states she has noticed the pain increasing since she has been carrying wood in for the winter. ? ?

## 2022-02-18 NOTE — Discharge Instructions (Signed)
The pain in your left shoulder is likely because of arthritis.  The EKG did not show any serious problems.  To help this, use heat on the sore area 3-4 times a day with a heating pad.  Also take Tylenol or Motrin 3-4 times a day to help with the discomfort.  Follow-up with your doctor next week for checkup.  If you continue to have pain he can order physical therapy to help you improve quicker. ?

## 2022-02-18 NOTE — ED Provider Notes (Signed)
?[facility] ?Provider Note ? ? ?CSN: [id number] ?Arrival date & time: 02/18/22  1108 ? ?  ? ?History ? ?Chief Complaint  ?Patient presents with  ? Shoulder Pain  ? ? ?[name] [name] is a 76 y.o. female. ? ?HPI ?She is here for evaluation of recurrent pain, left posterior shoulder region, possibly aggravated by lifting by carrying wood over the last few months.  No other known trauma.  No shortness of breath, cough, neck pain, weakness or dizziness.  No weight loss. ?  ? ?Home Medications ?Prior to Admission medications   ?Medication Sig Start Date End Date Taking? Authorizing Provider  ?carvedilol (COREG) 25 MG tablet Take 25 mg by mouth 2 (two) times daily with a meal.    [provider]  ?Cholecalciferol (VITAMIN D3) 25 MCG (1000 UT) CAPS Take 1,000 Units by mouth daily.    [provider]  ?ferrous sulfate 325 (65 FE) MG tablet Take 325 mg by mouth daily with breakfast.    [provider]  ?gemfibrozil (LOPID) 600 MG tablet Take 600 mg by mouth 2 (two) times daily before a meal.    [provider]  ?lisinopril (PRINIVIL,ZESTRIL) 20 MG tablet Take 20 mg by mouth daily.  12/31/17   [provider]  ?metFORMIN (GLUCOPHAGE) 1000 MG tablet Take 1,000 mg by mouth daily. 09/23/14   [provider]  ?simvastatin (ZOCOR) 40 MG tablet Take 40 mg by mouth at bedtime. 07/14/13   [provider]  ?   ? ?Allergies    ?Penicillins   ? ?Review of Systems   ?Review of Systems ? ?Physical Exam ?Updated Vital Signs ?BP (!) 201/93   Pulse (!) 56   Temp 98.5 ?F (36.9 ?C) (Oral)   Resp 20   Ht '5\' 7"'$  (1.702 m)   Wt 103.6 kg   SpO2 98%   BMI 35.77 kg/m?  ?Physical Exam ?Vitals and nursing note reviewed.  ?Constitutional:   ?   General: She is not in acute distress. ?   Appearance: She is well-developed. She is obese. She is not ill-appearing or toxic-appearing.  ?HENT:  ?   Head: Normocephalic and atraumatic.  ?   Right Ear: External ear normal.  ?   Left  Ear: External ear normal.  ?Eyes:  ?   Conjunctiva/sclera: Conjunctivae normal.  ?   Pupils: Pupils are equal, round, and reactive to light.  ?Neck:  ?   Trachea: Phonation normal.  ?Cardiovascular:  ?   Rate and Rhythm: Normal rate and regular rhythm.  ?   Heart sounds: Normal heart sounds.  ?Pulmonary:  ?   Effort: Pulmonary effort is normal. No respiratory distress.  ?   Breath sounds: Normal breath sounds. No stridor.  ?Abdominal:  ?   General: There is no distension.  ?   Palpations: Abdomen is soft.  ?   Tenderness: There is no abdominal tenderness.  ?Musculoskeletal:     ?   General: Tenderness (Mild tenderness, left upper medial scapular region, without palpable mass or deformity.) present. No swelling. Normal range of motion.  ?   Cervical back: Normal range of motion and neck supple.  ?   Comments: Body habitus prevents close palpation of all the thoracic structures.  ?Skin: ?   General: Skin is warm and dry.  ?Neurological:  ?   Mental Status: She is alert and oriented to person, place, and time.  ?   Cranial Nerves: No cranial nerve deficit.  ?  Sensory: No sensory deficit.  ?   Motor: No abnormal muscle tone.  ?   Coordination: Coordination normal.  ?Psychiatric:     ?   Mood and Affect: Mood normal.     ?   Behavior: Behavior normal.     ?   Thought Content: Thought content normal.     ?   Judgment: Judgment normal.  ? ? ?ED Results / Procedures / Treatments   ?Labs ?(all labs ordered are listed, but only abnormal results are displayed) ?Labs Reviewed - No data to display ? ?EKG ?None ? ?Radiology ?CT Shoulder Left Wo Contrast ? ?Result Date: 02/18/2022 ?CLINICAL DATA:  Left shoulder and upper back pain radiating into the left arm. Chronic left shoulder pain, osteoarthritis suspected. EXAM: CT OF THE UPPER LEFT EXTREMITY WITHOUT CONTRAST TECHNIQUE: Multidetector CT imaging of the left shoulder was performed according to the standard protocol. RADIATION DOSE REDUCTION: This exam was performed according  to the departmental dose-optimization program which includes automated exposure control, adjustment of the mA and/or kV according to patient size and/or use of iterative reconstruction technique. COMPARISON:  None. FINDINGS: Bones/Joint/Cartilage No evidence of acute fracture, dislocation or humeral head avascular necrosis. No significant glenohumeral arthropathy or large shoulder joint effusion identified. 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 In addition, there is calcification associated with the [place] head of the biceps tendon and distal infraspinatus tendon. There are moderate sternoclavicular and acromioclavicular degenerative changes with associated chondrocalcinosis and capsular calcifications. Ligaments Suboptimally assessed by CT. Muscles and Tendons No focal muscular atrophy. As above, calcification associated with the biceps and infraspinatus tendons. Soft tissues No periarticular fluid collections or inflammatory changes are identified. There is no axillary adenopathy. Mild aortic atherosclerosis noted. IMPRESSION: 1. No evidence of acute fracture or dislocation. 2. Degenerative changes as described with associated chondrocalcinosis and tendon calcification. 3. No significant muscular abnormalities identified by CT. Electronically Signed   By: Richardean Sale M.D.   On: 02/18/2022 13:16   ? ?Procedures ?Procedures  ? ? ?Medications Ordered in ED ?Medications - No data to display ? ?ED Course/ Medical Decision Making/ A&P ?  ?                        ?Medical Decision Making ?Patient presenting with no specific known trauma other than lifting wood, and having recurrent left shoulder pain for several months.  No other worrisome findings including weight loss, fever, cough or shortness of breath. ? ?Problems Addressed: ?Chronic left shoulder pain: chronic illness or injury ?   Details: Possibly related to lifting ? ?Amount and/or Complexity of Data Reviewed ?Independent Historian:  ?   Details:  She is a cogent historian ?Radiology: ordered and independent interpretation performed. ?   Details: CT left shoulder-degenerative changes, chondrocalcinosis and tendon calcification ?ECG/medicine tests: ordered and independent interpretation performed. ?   Details: Cardiac monitor-normal sinus rhythm ? ?Risk ?OTC drugs. ?Decision regarding hospitalization. ?Risk Details: Patient with nonspecific symptoms, and abnormal findings on CT consistent with localized left shoulder arthritis/tendinitis.  No evidence for associated mass or deformity of the chest wall or left shoulder.  Symptomatic treatment indicated.  No indication for hospitalization or further ED testing.  Recommending heat as needed, Tylenol or Motrin for pain and PCP follow-up with possible requirement for physical therapy if no improvement using symptomatic care. ? ? ? ? ? ? ? ? ? ? ?Final Clinical Impression(s) / ED Diagnoses ?Final diagnoses:  ?Chronic left shoulder pain  ? ? ?  Rx / DC Orders ?ED Discharge Orders   ? ? None  ? ?  ? ? ?  ?Daleen Bo, MD ?02/18/22 1337 ? ?

## 2022-03-14 ENCOUNTER — Other Ambulatory Visit (HOSPITAL_COMMUNITY): Payer: Self-pay | Admitting: Family Medicine

## 2022-03-14 DIAGNOSIS — Z1231 Encounter for screening mammogram for malignant neoplasm of breast: Secondary | ICD-10-CM

## 2022-04-05 ENCOUNTER — Ambulatory Visit (HOSPITAL_COMMUNITY)
Admission: RE | Admit: 2022-04-05 | Discharge: 2022-04-05 | Disposition: A | Discharge status: Home / Self Care | Payer: Medicare Other | Source: Ambulatory Visit | Attending: Family Medicine | Admitting: Family Medicine

## 2022-04-05 DIAGNOSIS — Z1231 Encounter for screening mammogram for malignant neoplasm of breast: Secondary | ICD-10-CM | POA: Insufficient documentation

## 2022-04-24 ENCOUNTER — Other Ambulatory Visit (HOSPITAL_COMMUNITY): Payer: Self-pay | Admitting: Nurse Practitioner

## 2022-04-24 DIAGNOSIS — Z780 Asymptomatic menopausal state: Secondary | ICD-10-CM

## 2022-04-24 DIAGNOSIS — E119 Type 2 diabetes mellitus without complications: Secondary | ICD-10-CM | POA: Diagnosis not present

## 2022-04-24 DIAGNOSIS — N183 Chronic kidney disease, stage 3 unspecified: Secondary | ICD-10-CM | POA: Diagnosis not present

## 2022-04-24 DIAGNOSIS — M25512 Pain in left shoulder: Secondary | ICD-10-CM | POA: Diagnosis not present

## 2022-04-24 DIAGNOSIS — I10 Essential (primary) hypertension: Secondary | ICD-10-CM | POA: Diagnosis not present

## 2022-05-01 ENCOUNTER — Ambulatory Visit (HOSPITAL_COMMUNITY)
Admission: RE | Admit: 2022-05-01 | Discharge: 2022-05-01 | Disposition: A | Discharge status: Home / Self Care | Payer: Medicare HMO | Source: Ambulatory Visit | Attending: Nurse Practitioner | Admitting: Nurse Practitioner

## 2022-05-01 DIAGNOSIS — Z780 Asymptomatic menopausal state: Secondary | ICD-10-CM | POA: Insufficient documentation

## 2022-05-02 DIAGNOSIS — R531 Weakness: Secondary | ICD-10-CM | POA: Diagnosis not present

## 2022-05-02 DIAGNOSIS — R293 Abnormal posture: Secondary | ICD-10-CM | POA: Diagnosis not present

## 2022-05-02 DIAGNOSIS — M25512 Pain in left shoulder: Secondary | ICD-10-CM | POA: Diagnosis not present

## 2022-05-02 DIAGNOSIS — M25511 Pain in right shoulder: Secondary | ICD-10-CM | POA: Diagnosis not present

## 2022-05-08 DIAGNOSIS — R293 Abnormal posture: Secondary | ICD-10-CM | POA: Diagnosis not present

## 2022-05-08 DIAGNOSIS — R531 Weakness: Secondary | ICD-10-CM | POA: Diagnosis not present

## 2022-05-08 DIAGNOSIS — M25511 Pain in right shoulder: Secondary | ICD-10-CM | POA: Diagnosis not present

## 2022-05-08 DIAGNOSIS — M25512 Pain in left shoulder: Secondary | ICD-10-CM | POA: Diagnosis not present

## 2022-05-12 DIAGNOSIS — R293 Abnormal posture: Secondary | ICD-10-CM | POA: Diagnosis not present

## 2022-05-12 DIAGNOSIS — M25511 Pain in right shoulder: Secondary | ICD-10-CM | POA: Diagnosis not present

## 2022-05-12 DIAGNOSIS — R531 Weakness: Secondary | ICD-10-CM | POA: Diagnosis not present

## 2022-05-12 DIAGNOSIS — M25512 Pain in left shoulder: Secondary | ICD-10-CM | POA: Diagnosis not present

## 2022-06-27 DIAGNOSIS — M7021 Olecranon bursitis, right elbow: Secondary | ICD-10-CM | POA: Diagnosis not present

## 2022-07-04 DIAGNOSIS — M7021 Olecranon bursitis, right elbow: Secondary | ICD-10-CM | POA: Diagnosis not present

## 2022-07-04 DIAGNOSIS — L03113 Cellulitis of right upper limb: Secondary | ICD-10-CM | POA: Diagnosis not present

## 2022-07-04 DIAGNOSIS — I10 Essential (primary) hypertension: Secondary | ICD-10-CM | POA: Diagnosis not present

## 2022-08-16 DIAGNOSIS — N390 Urinary tract infection, site not specified: Secondary | ICD-10-CM | POA: Diagnosis not present

## 2022-08-16 DIAGNOSIS — E119 Type 2 diabetes mellitus without complications: Secondary | ICD-10-CM | POA: Diagnosis not present

## 2022-08-16 DIAGNOSIS — R350 Frequency of micturition: Secondary | ICD-10-CM | POA: Diagnosis not present

## 2022-08-16 DIAGNOSIS — I10 Essential (primary) hypertension: Secondary | ICD-10-CM | POA: Diagnosis not present

## 2022-08-16 DIAGNOSIS — N183 Chronic kidney disease, stage 3 unspecified: Secondary | ICD-10-CM | POA: Diagnosis not present

## 2022-08-18 DIAGNOSIS — E782 Mixed hyperlipidemia: Secondary | ICD-10-CM | POA: Diagnosis not present

## 2022-08-18 DIAGNOSIS — E039 Hypothyroidism, unspecified: Secondary | ICD-10-CM | POA: Diagnosis not present

## 2022-08-18 DIAGNOSIS — E119 Type 2 diabetes mellitus without complications: Secondary | ICD-10-CM | POA: Diagnosis not present

## 2022-08-18 DIAGNOSIS — Z79899 Other long term (current) drug therapy: Secondary | ICD-10-CM | POA: Diagnosis not present

## 2022-08-18 DIAGNOSIS — I10 Essential (primary) hypertension: Secondary | ICD-10-CM | POA: Diagnosis not present

## 2022-08-18 DIAGNOSIS — J069 Acute upper respiratory infection, unspecified: Secondary | ICD-10-CM | POA: Diagnosis not present

## 2022-08-18 DIAGNOSIS — N183 Chronic kidney disease, stage 3 unspecified: Secondary | ICD-10-CM | POA: Diagnosis not present

## 2022-08-18 DIAGNOSIS — E6609 Other obesity due to excess calories: Secondary | ICD-10-CM | POA: Diagnosis not present

## 2022-09-25 DIAGNOSIS — N390 Urinary tract infection, site not specified: Secondary | ICD-10-CM | POA: Diagnosis not present

## 2022-09-25 DIAGNOSIS — N183 Chronic kidney disease, stage 3 unspecified: Secondary | ICD-10-CM | POA: Diagnosis not present

## 2022-09-25 DIAGNOSIS — Z79899 Other long term (current) drug therapy: Secondary | ICD-10-CM | POA: Diagnosis not present

## 2022-09-25 DIAGNOSIS — E6609 Other obesity due to excess calories: Secondary | ICD-10-CM | POA: Diagnosis not present

## 2022-09-25 DIAGNOSIS — J301 Allergic rhinitis due to pollen: Secondary | ICD-10-CM | POA: Diagnosis not present

## 2022-09-25 DIAGNOSIS — E782 Mixed hyperlipidemia: Secondary | ICD-10-CM | POA: Diagnosis not present

## 2022-09-25 DIAGNOSIS — D638 Anemia in other chronic diseases classified elsewhere: Secondary | ICD-10-CM | POA: Diagnosis not present

## 2022-09-25 DIAGNOSIS — E039 Hypothyroidism, unspecified: Secondary | ICD-10-CM | POA: Diagnosis not present

## 2022-09-25 DIAGNOSIS — J069 Acute upper respiratory infection, unspecified: Secondary | ICD-10-CM | POA: Diagnosis not present

## 2022-09-25 DIAGNOSIS — Z23 Encounter for immunization: Secondary | ICD-10-CM | POA: Diagnosis not present

## 2022-09-25 DIAGNOSIS — E119 Type 2 diabetes mellitus without complications: Secondary | ICD-10-CM | POA: Diagnosis not present

## 2022-09-25 DIAGNOSIS — E559 Vitamin D deficiency, unspecified: Secondary | ICD-10-CM | POA: Diagnosis not present

## 2022-09-25 DIAGNOSIS — I10 Essential (primary) hypertension: Secondary | ICD-10-CM | POA: Diagnosis not present

## 2022-09-25 DIAGNOSIS — M109 Gout, unspecified: Secondary | ICD-10-CM | POA: Diagnosis not present

## 2023-01-11 ENCOUNTER — Encounter: Payer: Self-pay | Admitting: Radiology

## 2023-01-19 DIAGNOSIS — Z7189 Other specified counseling: Secondary | ICD-10-CM | POA: Diagnosis not present

## 2023-01-19 DIAGNOSIS — N182 Chronic kidney disease, stage 2 (mild): Secondary | ICD-10-CM | POA: Diagnosis not present

## 2023-01-19 DIAGNOSIS — I10 Essential (primary) hypertension: Secondary | ICD-10-CM | POA: Diagnosis not present

## 2023-01-19 DIAGNOSIS — E119 Type 2 diabetes mellitus without complications: Secondary | ICD-10-CM | POA: Diagnosis not present

## 2023-01-24 DIAGNOSIS — N182 Chronic kidney disease, stage 2 (mild): Secondary | ICD-10-CM | POA: Diagnosis not present

## 2023-01-24 DIAGNOSIS — E782 Mixed hyperlipidemia: Secondary | ICD-10-CM | POA: Diagnosis not present

## 2023-01-24 DIAGNOSIS — E559 Vitamin D deficiency, unspecified: Secondary | ICD-10-CM | POA: Diagnosis not present

## 2023-01-24 DIAGNOSIS — E119 Type 2 diabetes mellitus without complications: Secondary | ICD-10-CM | POA: Diagnosis not present

## 2023-01-24 DIAGNOSIS — E6609 Other obesity due to excess calories: Secondary | ICD-10-CM | POA: Diagnosis not present

## 2023-01-24 DIAGNOSIS — D638 Anemia in other chronic diseases classified elsewhere: Secondary | ICD-10-CM | POA: Diagnosis not present

## 2023-02-23 DIAGNOSIS — M25572 Pain in left ankle and joints of left foot: Secondary | ICD-10-CM | POA: Diagnosis not present

## 2023-02-23 DIAGNOSIS — M109 Gout, unspecified: Secondary | ICD-10-CM | POA: Diagnosis not present

## 2023-02-28 DIAGNOSIS — M25572 Pain in left ankle and joints of left foot: Secondary | ICD-10-CM | POA: Diagnosis not present

## 2023-02-28 DIAGNOSIS — E119 Type 2 diabetes mellitus without complications: Secondary | ICD-10-CM | POA: Diagnosis not present

## 2023-03-09 ENCOUNTER — Other Ambulatory Visit (HOSPITAL_COMMUNITY): Payer: Self-pay | Admitting: Family Medicine

## 2023-03-09 DIAGNOSIS — Z1231 Encounter for screening mammogram for malignant neoplasm of breast: Secondary | ICD-10-CM

## 2023-04-11 ENCOUNTER — Encounter (HOSPITAL_COMMUNITY): Payer: Self-pay

## 2023-04-11 ENCOUNTER — Ambulatory Visit (HOSPITAL_COMMUNITY)
Admission: RE | Admit: 2023-04-11 | Discharge: 2023-04-11 | Disposition: A | Discharge status: Home / Self Care | Payer: Medicare HMO | Source: Ambulatory Visit | Attending: Family Medicine | Admitting: Family Medicine

## 2023-04-11 DIAGNOSIS — Z1231 Encounter for screening mammogram for malignant neoplasm of breast: Secondary | ICD-10-CM | POA: Diagnosis not present

## 2023-04-20 DIAGNOSIS — B3731 Acute candidiasis of vulva and vagina: Secondary | ICD-10-CM | POA: Diagnosis not present

## 2023-04-20 DIAGNOSIS — N750 Cyst of Bartholin's gland: Secondary | ICD-10-CM | POA: Diagnosis not present

## 2023-04-20 DIAGNOSIS — E119 Type 2 diabetes mellitus without complications: Secondary | ICD-10-CM | POA: Diagnosis not present

## 2023-04-20 DIAGNOSIS — I10 Essential (primary) hypertension: Secondary | ICD-10-CM | POA: Diagnosis not present

## 2023-04-23 ENCOUNTER — Encounter: Payer: Medicare HMO | Admitting: Obstetrics & Gynecology

## 2023-04-26 DIAGNOSIS — E119 Type 2 diabetes mellitus without complications: Secondary | ICD-10-CM | POA: Diagnosis not present

## 2023-04-26 DIAGNOSIS — N182 Chronic kidney disease, stage 2 (mild): Secondary | ICD-10-CM | POA: Diagnosis not present

## 2023-04-26 DIAGNOSIS — E559 Vitamin D deficiency, unspecified: Secondary | ICD-10-CM | POA: Diagnosis not present

## 2023-04-26 DIAGNOSIS — E782 Mixed hyperlipidemia: Secondary | ICD-10-CM | POA: Diagnosis not present

## 2023-04-26 DIAGNOSIS — D638 Anemia in other chronic diseases classified elsewhere: Secondary | ICD-10-CM | POA: Diagnosis not present

## 2023-04-26 DIAGNOSIS — E6609 Other obesity due to excess calories: Secondary | ICD-10-CM | POA: Diagnosis not present

## 2023-05-10 ENCOUNTER — Ambulatory Visit: Payer: Medicare HMO | Admitting: Obstetrics & Gynecology

## 2023-05-10 ENCOUNTER — Encounter: Payer: Self-pay | Admitting: Obstetrics & Gynecology

## 2023-05-10 ENCOUNTER — Other Ambulatory Visit (HOSPITAL_COMMUNITY)
Admission: RE | Admit: 2023-05-10 | Discharge: 2023-05-10 | Disposition: A | Discharge status: Home / Self Care | Payer: Medicare HMO | Source: Ambulatory Visit | Attending: Obstetrics & Gynecology | Admitting: Obstetrics & Gynecology

## 2023-05-10 VITALS — BP 171/95 | HR 71 | Ht 66.0 in | Wt 214.0 lb

## 2023-05-10 DIAGNOSIS — L723 Sebaceous cyst: Secondary | ICD-10-CM

## 2023-05-10 NOTE — Addendum Note (Signed)
Addended by: Moss Mc on: 05/10/2023 04:32 PM   Modules accepted: Orders

## 2023-05-10 NOTE — Progress Notes (Signed)
Left vulvar Cyst Excision Procedure Note  Pre-operative Diagnosis: left vulvar cyst  Post-operative Diagnosis: same  Locations:left vulva   Indications: firm indurated cyst previously infected  Anesthesia: Bupivacaine 0.25% with epinephrine without added sodium bicarbonate  Procedure Details  History of allergy to iodine: no  Patient informed of the risks (including bleeding and infection) and benefits of the  procedure and Written informed consent obtained.  The lesion and surrounding area was given a sterile prep using betadyne and draped in the usual sterile fashion. An incision was made over the cyst, which was dissected free of the surrounding tissue and removed.  The cyst was filled with typical sebaceous material.  The wound was closed with 3-0 ethilon using simple interrupted stitches. Antibiotic ointment and a sterile dressing applied.  The specimen was sent for pathologic examination. The patient tolerated the procedure well.  EBL: <5 ml  Findings: Tender firm nodule of the vulva  Condition: Stable  Complications: none.  Plan: 1. Instructed to keep the wound dry and covered for 24-48h and clean thereafter. 2. Warning signs of infection were reviewed.   3. Recommended that the patient use OTC analgesics as needed for pain.  4. Return for suture removal in 2 weeks.

## 2023-05-14 LAB — SURGICAL PATHOLOGY

## 2023-05-15 ENCOUNTER — Encounter: Payer: Medicare HMO | Admitting: Obstetrics & Gynecology

## 2023-05-15 DIAGNOSIS — E6609 Other obesity due to excess calories: Secondary | ICD-10-CM | POA: Diagnosis not present

## 2023-05-15 DIAGNOSIS — I10 Essential (primary) hypertension: Secondary | ICD-10-CM | POA: Diagnosis not present

## 2023-05-15 DIAGNOSIS — N182 Chronic kidney disease, stage 2 (mild): Secondary | ICD-10-CM | POA: Diagnosis not present

## 2023-05-15 DIAGNOSIS — E119 Type 2 diabetes mellitus without complications: Secondary | ICD-10-CM | POA: Diagnosis not present

## 2023-05-25 ENCOUNTER — Ambulatory Visit (INDEPENDENT_AMBULATORY_CARE_PROVIDER_SITE_OTHER): Payer: Medicare HMO | Admitting: Obstetrics & Gynecology

## 2023-05-25 ENCOUNTER — Encounter: Payer: Self-pay | Admitting: Obstetrics & Gynecology

## 2023-05-25 VITALS — BP 150/82 | HR 64 | Ht 66.0 in | Wt 214.0 lb

## 2023-05-25 DIAGNOSIS — Z4802 Encounter for removal of sutures: Secondary | ICD-10-CM

## 2023-05-25 NOTE — Progress Notes (Signed)
Follow up appointment for post op suture removal   Chief Complaint  Patient presents with   Suture / Staple Removal    Blood pressure (!) 156/84, pulse 64, height 5\' 6"  (1.676 m), weight 214 lb (97.1 kg).  Left vulva is well healed no signs of infection 5 sutures removed No bleeding Neosporin applied  MEDS ordered this encounter: No orders of the defined types were placed in this encounter.   Orders for this encounter: No orders of the defined types were placed in this encounter.   Impression + Management Plan   ICD-10-CM   1. Encounter for removal of sutures  Z48.02       Follow Up: Return if symptoms worsen or fail to improve.     All questions were answered.  Past Medical History:  Diagnosis Date   Anemia    Diabetes mellitus    Gout    right elbow   Hypercholesteremia    Hypertension    Vertigo    history    Past Surgical History:  Procedure Laterality Date   ABDOMINAL HYSTERECTOMY     BREAST BIOPSY Left    COLONOSCOPY N/A 02/04/2018   Surgeon: West Bali, MD;  three 3-5 mm polyps and one 6 mm polyp, mild to moderate diverticulosis in the rectosigmoid and sigmoid colon, and internal hemorrhoids.  Pathology revealed tubular adenomas.  Recommended repeat colonoscopy in 3 years.   COLONOSCOPY WITH PROPOFOL N/A 05/02/2021   Procedure: COLONOSCOPY WITH PROPOFOL;  Surgeon: Lanelle Bal, DO;  Location: [facility];  Service: Endoscopy;  Laterality: N/A;  8:30am   POLYPECTOMY  05/02/2021   Procedure: POLYPECTOMY;  Surgeon: Lanelle Bal, DO;  Location: [facility];  Service: Endoscopy;;   REPLACEMENT TOTAL KNEE     right    OB History     Gravida  3   Para      Term      Preterm      AB      Living         SAB      IAB      Ectopic      Multiple      Live Births              Allergies  Allergen Reactions   Penicillins Hives and Other (See Comments)    Has patient had a PCN reaction causing immediate rash,  facial/tongue/throat swelling, SOB or lightheadedness with hypotension: No Has patient had a PCN reaction causing severe rash involving mucus membranes or skin necrosis: No Has patient had a PCN reaction that required hospitalization: No Has patient had a PCN reaction occurring within the last 10 years: No If all of the above answers are "NO", then may proceed with Cephalosporin use.     Social History   Socioeconomic History   Marital status: Widowed    Spouse name: Not on file   Number of children: Not on file   Years of education: Not on file   Highest education level: Not on file  Occupational History   Not on file  Tobacco Use   Smoking status: Never   Smokeless tobacco: Never  Vaping Use   Vaping status: Never Used  Substance and Sexual Activity   Alcohol use: No   Drug use: No   Sexual activity: Not Currently    Birth control/protection: Surgical    Comment: hyst  Other Topics Concern   Not on file  Social History  Narrative   Not on file   Social Determinants of Health   Financial Resource Strain: Low Risk  (05/10/2023)   Overall Financial Resource Strain (CARDIA)    Difficulty of Paying Living Expenses: Not very hard  Food Insecurity: Food Insecurity Present (05/10/2023)   Hunger Vital Sign    Worried About Running Out of Food in the Last Year: Sometimes true    Ran Out of Food in the Last Year: Sometimes true  Transportation Needs: Unmet Transportation Needs (05/10/2023)   PRAPARE - Administrator, Civil Service (Medical): Yes    Lack of Transportation (Non-Medical): No  Physical Activity: Insufficiently Active (05/10/2023)   Exercise Vital Sign    Days of Exercise per Week: 3 days    Minutes of Exercise per Session: 30 min  Stress: No Stress Concern Present (05/10/2023)   [employer] of Occupational Health - Occupational Stress Questionnaire    Feeling of Stress : Not at all  Social Connections: Moderately Isolated (05/10/2023)   Social  Connection and Isolation Panel [NHANES]    Frequency of Communication with Friends and Family: Three times a week    Frequency of Social Gatherings with Friends and Family: Three times a week    Attends Religious Services: More than 4 times per year    Active Member of Clubs or Organizations: No    Attends Banker Meetings: Patient declined    Marital Status: Widowed    Family History  Problem Relation Age of Onset   Colon cancer Sister        22s

## 2023-06-04 DIAGNOSIS — E119 Type 2 diabetes mellitus without complications: Secondary | ICD-10-CM | POA: Diagnosis not present

## 2023-06-04 DIAGNOSIS — H25013 Cortical age-related cataract, bilateral: Secondary | ICD-10-CM | POA: Diagnosis not present

## 2023-06-04 DIAGNOSIS — H2513 Age-related nuclear cataract, bilateral: Secondary | ICD-10-CM | POA: Diagnosis not present

## 2023-06-04 DIAGNOSIS — H524 Presbyopia: Secondary | ICD-10-CM | POA: Diagnosis not present

## 2023-06-22 DIAGNOSIS — E118 Type 2 diabetes mellitus with unspecified complications: Secondary | ICD-10-CM | POA: Diagnosis not present

## 2023-06-22 DIAGNOSIS — E7849 Other hyperlipidemia: Secondary | ICD-10-CM | POA: Diagnosis not present

## 2023-06-22 DIAGNOSIS — I10 Essential (primary) hypertension: Secondary | ICD-10-CM | POA: Diagnosis not present

## 2023-07-05 DIAGNOSIS — I10 Essential (primary) hypertension: Secondary | ICD-10-CM | POA: Diagnosis not present

## 2023-07-05 DIAGNOSIS — M109 Gout, unspecified: Secondary | ICD-10-CM | POA: Diagnosis not present

## 2023-07-05 DIAGNOSIS — E1165 Type 2 diabetes mellitus with hyperglycemia: Secondary | ICD-10-CM | POA: Diagnosis not present

## 2023-07-11 DIAGNOSIS — Z23 Encounter for immunization: Secondary | ICD-10-CM | POA: Diagnosis not present

## 2023-07-11 DIAGNOSIS — E7849 Other hyperlipidemia: Secondary | ICD-10-CM | POA: Diagnosis not present

## 2023-07-11 DIAGNOSIS — Z1331 Encounter for screening for depression: Secondary | ICD-10-CM | POA: Diagnosis not present

## 2023-07-11 DIAGNOSIS — M1A0410 Idiopathic chronic gout, right hand, without tophus (tophi): Secondary | ICD-10-CM | POA: Diagnosis not present

## 2023-07-11 DIAGNOSIS — E118 Type 2 diabetes mellitus with unspecified complications: Secondary | ICD-10-CM | POA: Diagnosis not present

## 2023-07-11 DIAGNOSIS — I10 Essential (primary) hypertension: Secondary | ICD-10-CM | POA: Diagnosis not present

## 2023-07-11 DIAGNOSIS — Z0001 Encounter for general adult medical examination with abnormal findings: Secondary | ICD-10-CM | POA: Diagnosis not present

## 2023-07-11 DIAGNOSIS — Z1389 Encounter for screening for other disorder: Secondary | ICD-10-CM | POA: Diagnosis not present

## 2023-10-10 DIAGNOSIS — Z23 Encounter for immunization: Secondary | ICD-10-CM | POA: Diagnosis not present

## 2023-10-10 DIAGNOSIS — E7849 Other hyperlipidemia: Secondary | ICD-10-CM | POA: Diagnosis not present

## 2023-10-10 DIAGNOSIS — M1A0410 Idiopathic chronic gout, right hand, without tophus (tophi): Secondary | ICD-10-CM | POA: Diagnosis not present

## 2023-10-10 DIAGNOSIS — I10 Essential (primary) hypertension: Secondary | ICD-10-CM | POA: Diagnosis not present

## 2023-10-10 DIAGNOSIS — E118 Type 2 diabetes mellitus with unspecified complications: Secondary | ICD-10-CM | POA: Diagnosis not present

## 2023-10-10 DIAGNOSIS — Z6833 Body mass index (BMI) 33.0-33.9, adult: Secondary | ICD-10-CM | POA: Diagnosis not present

## 2023-11-09 DIAGNOSIS — E118 Type 2 diabetes mellitus with unspecified complications: Secondary | ICD-10-CM | POA: Diagnosis not present

## 2023-11-09 DIAGNOSIS — I10 Essential (primary) hypertension: Secondary | ICD-10-CM | POA: Diagnosis not present

## 2023-12-10 DIAGNOSIS — I10 Essential (primary) hypertension: Secondary | ICD-10-CM | POA: Diagnosis not present

## 2023-12-10 DIAGNOSIS — E118 Type 2 diabetes mellitus with unspecified complications: Secondary | ICD-10-CM | POA: Diagnosis not present

## 2024-01-09 DIAGNOSIS — I10 Essential (primary) hypertension: Secondary | ICD-10-CM | POA: Diagnosis not present

## 2024-01-09 DIAGNOSIS — E118 Type 2 diabetes mellitus with unspecified complications: Secondary | ICD-10-CM | POA: Diagnosis not present

## 2024-02-06 DIAGNOSIS — E118 Type 2 diabetes mellitus with unspecified complications: Secondary | ICD-10-CM | POA: Diagnosis not present

## 2024-02-06 DIAGNOSIS — I10 Essential (primary) hypertension: Secondary | ICD-10-CM | POA: Diagnosis not present

## 2024-03-08 DIAGNOSIS — E118 Type 2 diabetes mellitus with unspecified complications: Secondary | ICD-10-CM | POA: Diagnosis not present

## 2024-03-08 DIAGNOSIS — I10 Essential (primary) hypertension: Secondary | ICD-10-CM | POA: Diagnosis not present

## 2024-03-13 ENCOUNTER — Other Ambulatory Visit (HOSPITAL_COMMUNITY): Payer: Self-pay | Admitting: Internal Medicine

## 2024-03-13 DIAGNOSIS — Z1231 Encounter for screening mammogram for malignant neoplasm of breast: Secondary | ICD-10-CM

## 2024-03-31 DIAGNOSIS — Z6833 Body mass index (BMI) 33.0-33.9, adult: Secondary | ICD-10-CM | POA: Diagnosis not present

## 2024-03-31 DIAGNOSIS — E7849 Other hyperlipidemia: Secondary | ICD-10-CM | POA: Diagnosis not present

## 2024-03-31 DIAGNOSIS — I10 Essential (primary) hypertension: Secondary | ICD-10-CM | POA: Diagnosis not present

## 2024-03-31 DIAGNOSIS — E118 Type 2 diabetes mellitus with unspecified complications: Secondary | ICD-10-CM | POA: Diagnosis not present

## 2024-04-11 ENCOUNTER — Ambulatory Visit (HOSPITAL_COMMUNITY)
Admission: RE | Admit: 2024-04-11 | Discharge: 2024-04-11 | Disposition: A | Discharge status: Home / Self Care | Source: Ambulatory Visit | Attending: Internal Medicine | Admitting: Internal Medicine

## 2024-04-11 DIAGNOSIS — Z1231 Encounter for screening mammogram for malignant neoplasm of breast: Secondary | ICD-10-CM | POA: Insufficient documentation
